# Patient Record
Sex: Female | Born: 1935
Health system: Southern US, Community
[De-identification: ages and names within clinical notes are randomized; demographics above are authoritative.]

## PROBLEM LIST (undated history)

## (undated) DIAGNOSIS — B019 Varicella without complication: Secondary | ICD-10-CM

## (undated) DIAGNOSIS — K900 Celiac disease: Secondary | ICD-10-CM

## (undated) DIAGNOSIS — I10 Essential (primary) hypertension: Secondary | ICD-10-CM

## (undated) DIAGNOSIS — T7840XA Allergy, unspecified, initial encounter: Secondary | ICD-10-CM

## (undated) DIAGNOSIS — E039 Hypothyroidism, unspecified: Secondary | ICD-10-CM

## (undated) DIAGNOSIS — Z8489 Family history of other specified conditions: Secondary | ICD-10-CM

## (undated) DIAGNOSIS — K219 Gastro-esophageal reflux disease without esophagitis: Secondary | ICD-10-CM

## (undated) DIAGNOSIS — K5790 Diverticulosis of intestine, part unspecified, without perforation or abscess without bleeding: Secondary | ICD-10-CM

## (undated) DIAGNOSIS — M1990 Unspecified osteoarthritis, unspecified site: Secondary | ICD-10-CM

## (undated) DIAGNOSIS — G2581 Restless legs syndrome: Secondary | ICD-10-CM

## (undated) DIAGNOSIS — H25019 Cortical age-related cataract, unspecified eye: Secondary | ICD-10-CM

## (undated) DIAGNOSIS — K52831 Collagenous colitis: Secondary | ICD-10-CM

## (undated) HISTORY — DX: Varicella without complication: B01.9

## (undated) HISTORY — DX: Restless legs syndrome: G25.81

## (undated) HISTORY — DX: Gastro-esophageal reflux disease without esophagitis: K21.9

## (undated) HISTORY — DX: Hypothyroidism, unspecified: E03.9

## (undated) HISTORY — DX: Essential (primary) hypertension: I10

## (undated) HISTORY — DX: Collagenous colitis: K52.831

## (undated) HISTORY — DX: Allergy, unspecified, initial encounter: T78.40XA

## (undated) HISTORY — DX: Celiac disease: K90.0

## (undated) HISTORY — PX: TONSILLECTOMY: SUR1361

---
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
## 1969-05-25 HISTORY — PX: ABDOMINAL HYSTERECTOMY: SHX81

## 1977-05-25 HISTORY — PX: CHOLECYSTECTOMY: SHX55

## 1997-10-25 ENCOUNTER — Ambulatory Visit (HOSPITAL_COMMUNITY): Admission: RE | Admit: 1997-10-25 | Discharge: 1997-10-25 | Payer: Self-pay | Admitting: Unknown Physician Specialty

## 1998-10-17 ENCOUNTER — Ambulatory Visit (HOSPITAL_COMMUNITY): Admission: RE | Admit: 1998-10-17 | Discharge: 1998-10-17 | Payer: Self-pay | Admitting: Unknown Physician Specialty

## 1999-11-27 ENCOUNTER — Ambulatory Visit (HOSPITAL_COMMUNITY): Admission: RE | Admit: 1999-11-27 | Discharge: 1999-11-27 | Payer: Self-pay | Admitting: *Deleted

## 1999-11-27 ENCOUNTER — Encounter: Payer: Self-pay | Admitting: Obstetrics & Gynecology

## 2000-11-30 ENCOUNTER — Ambulatory Visit (HOSPITAL_COMMUNITY): Admission: RE | Admit: 2000-11-30 | Discharge: 2000-11-30 | Payer: Self-pay | Admitting: *Deleted

## 2004-03-06 ENCOUNTER — Ambulatory Visit: Payer: Self-pay | Admitting: Unknown Physician Specialty

## 2004-06-25 ENCOUNTER — Ambulatory Visit: Payer: Self-pay

## 2004-07-15 ENCOUNTER — Ambulatory Visit: Payer: Self-pay

## 2004-08-19 ENCOUNTER — Ambulatory Visit: Payer: Self-pay

## 2005-03-12 ENCOUNTER — Ambulatory Visit: Payer: Self-pay | Admitting: Unknown Physician Specialty

## 2005-03-20 ENCOUNTER — Ambulatory Visit: Payer: Self-pay | Admitting: Unknown Physician Specialty

## 2005-06-22 ENCOUNTER — Ambulatory Visit: Payer: Self-pay | Admitting: Gastroenterology

## 2005-08-21 ENCOUNTER — Ambulatory Visit: Payer: Self-pay | Admitting: Gastroenterology

## 2005-09-14 ENCOUNTER — Ambulatory Visit: Payer: Self-pay | Admitting: Gastroenterology

## 2005-10-14 ENCOUNTER — Ambulatory Visit: Payer: Self-pay | Admitting: Unknown Physician Specialty

## 2006-05-25 HISTORY — PX: ROTATOR CUFF REPAIR: SHX139

## 2006-09-16 ENCOUNTER — Ambulatory Visit: Payer: Self-pay | Admitting: Unknown Physician Specialty

## 2007-08-29 ENCOUNTER — Ambulatory Visit: Payer: Self-pay | Admitting: Gastroenterology

## 2007-09-20 ENCOUNTER — Ambulatory Visit: Payer: Self-pay | Admitting: Unknown Physician Specialty

## 2008-05-25 HISTORY — PX: CATARACT EXTRACTION W/ INTRAOCULAR LENS IMPLANT: SHX1309

## 2008-09-25 ENCOUNTER — Ambulatory Visit: Payer: Self-pay | Admitting: Unknown Physician Specialty

## 2009-11-12 ENCOUNTER — Ambulatory Visit: Payer: Self-pay | Admitting: Unknown Physician Specialty

## 2010-05-23 ENCOUNTER — Ambulatory Visit: Payer: Self-pay | Admitting: Gastroenterology

## 2010-05-27 LAB — PATHOLOGY REPORT

## 2010-12-02 ENCOUNTER — Ambulatory Visit: Payer: Self-pay | Admitting: Unknown Physician Specialty

## 2012-01-06 ENCOUNTER — Ambulatory Visit: Payer: Self-pay | Admitting: Internal Medicine

## 2012-04-08 ENCOUNTER — Ambulatory Visit: Payer: Self-pay | Admitting: Gastroenterology

## 2012-05-17 ENCOUNTER — Other Ambulatory Visit: Payer: Self-pay | Admitting: Gastroenterology

## 2012-05-17 LAB — CLOSTRIDIUM DIFFICILE BY PCR

## 2012-05-17 LAB — WBCS, STOOL

## 2012-05-19 LAB — STOOL CULTURE

## 2012-06-16 ENCOUNTER — Ambulatory Visit: Payer: Self-pay | Admitting: Internal Medicine

## 2013-03-02 ENCOUNTER — Ambulatory Visit: Payer: Self-pay | Admitting: Internal Medicine

## 2013-11-13 DIAGNOSIS — K219 Gastro-esophageal reflux disease without esophagitis: Secondary | ICD-10-CM | POA: Insufficient documentation

## 2013-11-22 ENCOUNTER — Ambulatory Visit: Payer: Self-pay | Admitting: Internal Medicine

## 2013-11-27 DIAGNOSIS — R911 Solitary pulmonary nodule: Secondary | ICD-10-CM | POA: Insufficient documentation

## 2014-03-13 ENCOUNTER — Ambulatory Visit: Payer: Self-pay | Admitting: Internal Medicine

## 2014-11-20 ENCOUNTER — Other Ambulatory Visit: Payer: Self-pay | Admitting: Specialist

## 2014-11-20 DIAGNOSIS — R911 Solitary pulmonary nodule: Secondary | ICD-10-CM

## 2014-11-28 ENCOUNTER — Ambulatory Visit
Admission: RE | Admit: 2014-11-28 | Discharge: 2014-11-28 | Disposition: A | Discharge status: Home / Self Care | Payer: PPO | Source: Ambulatory Visit | Attending: Specialist | Admitting: Specialist

## 2014-11-28 DIAGNOSIS — R911 Solitary pulmonary nodule: Secondary | ICD-10-CM

## 2014-11-28 DIAGNOSIS — I2510 Atherosclerotic heart disease of native coronary artery without angina pectoris: Secondary | ICD-10-CM | POA: Insufficient documentation

## 2015-01-09 DIAGNOSIS — E039 Hypothyroidism, unspecified: Secondary | ICD-10-CM | POA: Insufficient documentation

## 2015-01-09 DIAGNOSIS — G2581 Restless legs syndrome: Secondary | ICD-10-CM | POA: Insufficient documentation

## 2015-03-21 ENCOUNTER — Other Ambulatory Visit: Payer: Self-pay | Admitting: Internal Medicine

## 2015-03-21 DIAGNOSIS — Z1231 Encounter for screening mammogram for malignant neoplasm of breast: Secondary | ICD-10-CM

## 2015-03-26 ENCOUNTER — Ambulatory Visit
Admission: RE | Admit: 2015-03-26 | Discharge: 2015-03-26 | Disposition: A | Discharge status: Home / Self Care | Payer: PPO | Source: Ambulatory Visit | Attending: Internal Medicine | Admitting: Internal Medicine

## 2015-03-26 DIAGNOSIS — Z1231 Encounter for screening mammogram for malignant neoplasm of breast: Secondary | ICD-10-CM | POA: Insufficient documentation

## 2015-06-16 DIAGNOSIS — R05 Cough: Secondary | ICD-10-CM | POA: Diagnosis not present

## 2015-09-19 DIAGNOSIS — E038 Other specified hypothyroidism: Secondary | ICD-10-CM | POA: Diagnosis not present

## 2015-09-19 DIAGNOSIS — G2581 Restless legs syndrome: Secondary | ICD-10-CM | POA: Diagnosis not present

## 2015-09-19 DIAGNOSIS — K219 Gastro-esophageal reflux disease without esophagitis: Secondary | ICD-10-CM | POA: Diagnosis not present

## 2015-09-19 DIAGNOSIS — E782 Mixed hyperlipidemia: Secondary | ICD-10-CM | POA: Diagnosis not present

## 2015-09-19 DIAGNOSIS — D649 Anemia, unspecified: Secondary | ICD-10-CM | POA: Diagnosis not present

## 2015-09-19 DIAGNOSIS — I10 Essential (primary) hypertension: Secondary | ICD-10-CM | POA: Diagnosis not present

## 2015-11-07 DIAGNOSIS — K900 Celiac disease: Secondary | ICD-10-CM | POA: Diagnosis not present

## 2015-11-07 DIAGNOSIS — K52831 Collagenous colitis: Secondary | ICD-10-CM | POA: Diagnosis not present

## 2015-11-07 DIAGNOSIS — K529 Noninfective gastroenteritis and colitis, unspecified: Secondary | ICD-10-CM | POA: Diagnosis not present

## 2015-11-10 ENCOUNTER — Other Ambulatory Visit
Admission: RE | Admit: 2015-11-10 | Discharge: 2015-11-10 | Disposition: A | Discharge status: Home / Self Care | Payer: PPO | Source: Ambulatory Visit | Attending: Gastroenterology | Admitting: Gastroenterology

## 2015-11-10 DIAGNOSIS — K529 Noninfective gastroenteritis and colitis, unspecified: Secondary | ICD-10-CM | POA: Diagnosis not present

## 2015-11-10 LAB — GASTROINTESTINAL PANEL BY PCR, STOOL (REPLACES STOOL CULTURE)
ADENOVIRUS F40/41: NOT DETECTED
ASTROVIRUS: NOT DETECTED
CYCLOSPORA CAYETANENSIS: NOT DETECTED
Campylobacter species: NOT DETECTED
Cryptosporidium: NOT DETECTED
E. COLI O157: NOT DETECTED
ENTAMOEBA HISTOLYTICA: NOT DETECTED
ENTEROPATHOGENIC E COLI (EPEC): NOT DETECTED
ENTEROTOXIGENIC E COLI (ETEC): NOT DETECTED
Enteroaggregative E coli (EAEC): NOT DETECTED
Giardia lamblia: NOT DETECTED
NOROVIRUS GI/GII: NOT DETECTED
Plesimonas shigelloides: NOT DETECTED
ROTAVIRUS A: NOT DETECTED
SAPOVIRUS (I, II, IV, AND V): NOT DETECTED
SHIGELLA/ENTEROINVASIVE E COLI (EIEC): NOT DETECTED
Salmonella species: NOT DETECTED
Shiga like toxin producing E coli (STEC): NOT DETECTED
VIBRIO CHOLERAE: NOT DETECTED
VIBRIO SPECIES: NOT DETECTED
Yersinia enterocolitica: NOT DETECTED

## 2015-11-10 LAB — C DIFFICILE QUICK SCREEN W PCR REFLEX
C DIFFICILE (CDIFF) INTERP: NEGATIVE
C Diff antigen: NEGATIVE
C Diff toxin: NEGATIVE

## 2015-12-12 DIAGNOSIS — M25562 Pain in left knee: Secondary | ICD-10-CM | POA: Diagnosis not present

## 2015-12-12 DIAGNOSIS — M1712 Unilateral primary osteoarthritis, left knee: Secondary | ICD-10-CM | POA: Diagnosis not present

## 2015-12-20 ENCOUNTER — Encounter: Payer: Self-pay | Admitting: Family Medicine

## 2015-12-20 ENCOUNTER — Ambulatory Visit (INDEPENDENT_AMBULATORY_CARE_PROVIDER_SITE_OTHER): Payer: PPO | Admitting: Family Medicine

## 2015-12-20 VITALS — BP 126/84 | HR 88 | Temp 98.1°F | Wt 175.0 lb

## 2015-12-20 DIAGNOSIS — I2510 Atherosclerotic heart disease of native coronary artery without angina pectoris: Secondary | ICD-10-CM | POA: Diagnosis not present

## 2015-12-20 DIAGNOSIS — Z0001 Encounter for general adult medical examination with abnormal findings: Secondary | ICD-10-CM

## 2015-12-20 DIAGNOSIS — Z862 Personal history of diseases of the blood and blood-forming organs and certain disorders involving the immune mechanism: Secondary | ICD-10-CM | POA: Diagnosis not present

## 2015-12-20 DIAGNOSIS — I10 Essential (primary) hypertension: Secondary | ICD-10-CM

## 2015-12-20 DIAGNOSIS — R7303 Prediabetes: Secondary | ICD-10-CM

## 2015-12-20 DIAGNOSIS — I2584 Coronary atherosclerosis due to calcified coronary lesion: Secondary | ICD-10-CM | POA: Diagnosis not present

## 2015-12-20 DIAGNOSIS — D649 Anemia, unspecified: Secondary | ICD-10-CM

## 2015-12-20 DIAGNOSIS — E039 Hypothyroidism, unspecified: Secondary | ICD-10-CM

## 2015-12-20 DIAGNOSIS — R6889 Other general symptoms and signs: Secondary | ICD-10-CM

## 2015-12-20 LAB — COMPREHENSIVE METABOLIC PANEL
ALBUMIN: 3.7 g/dL (ref 3.6–5.1)
ALT: 11 U/L (ref 6–29)
AST: 15 U/L (ref 10–35)
Alkaline Phosphatase: 103 U/L (ref 33–130)
BILIRUBIN TOTAL: 0.3 mg/dL (ref 0.2–1.2)
BUN: 10 mg/dL (ref 7–25)
CHLORIDE: 96 mmol/L — AB (ref 98–110)
CO2: 31 mmol/L (ref 20–31)
CREATININE: 0.58 mg/dL — AB (ref 0.60–0.93)
Calcium: 9.1 mg/dL (ref 8.6–10.4)
Glucose, Bld: 124 mg/dL — ABNORMAL HIGH (ref 65–99)
Potassium: 3.2 mmol/L — ABNORMAL LOW (ref 3.5–5.3)
Sodium: 135 mmol/L (ref 135–146)
TOTAL PROTEIN: 5.7 g/dL — AB (ref 6.1–8.1)

## 2015-12-20 LAB — CBC
HEMATOCRIT: 34.3 % — AB (ref 35.0–45.0)
HEMOGLOBIN: 11.7 g/dL (ref 11.7–15.5)
MCH: 31.5 pg (ref 27.0–33.0)
MCHC: 34.1 g/dL (ref 32.0–36.0)
MCV: 92.2 fL (ref 80.0–100.0)
MPV: 9.1 fL (ref 7.5–12.5)
Platelets: 254 10*3/uL (ref 140–400)
RBC: 3.72 MIL/uL — AB (ref 3.80–5.10)
RDW: 13.9 % (ref 11.0–15.0)
WBC: 4.7 10*3/uL (ref 3.8–10.8)

## 2015-12-20 LAB — IRON AND TIBC
%SAT: 19 % (ref 11–50)
IRON: 64 ug/dL (ref 45–160)
TIBC: 333 ug/dL (ref 250–450)
UIBC: 269 ug/dL (ref 125–400)

## 2015-12-20 LAB — FERRITIN: Ferritin: 27 ng/mL (ref 20–288)

## 2015-12-20 LAB — LIPID PANEL
Cholesterol: 146 mg/dL (ref 125–200)
HDL: 30 mg/dL — AB (ref >=46)
LDL CALC: 74 mg/dL (ref <130)
Total CHOL/HDL Ratio: 4.9 Ratio (ref <=5.0)
Triglycerides: 211 mg/dL — ABNORMAL HIGH (ref <150)
VLDL: 42 mg/dL — ABNORMAL HIGH (ref <30)

## 2015-12-20 LAB — TSH: TSH: 0.39 m[IU]/L — AB

## 2015-12-20 LAB — HEMOGLOBIN A1C
HEMOGLOBIN A1C: 5.6 % (ref <5.7)
MEAN PLASMA GLUCOSE: 114 mg/dL

## 2015-12-20 MED ORDER — ZOSTER VACCINE LIVE 19400 UNT/0.65ML ~~LOC~~ SUSR: 0.6500 mL | Freq: Once | SUBCUTANEOUS | 0 refills | Status: AC

## 2015-12-20 NOTE — Patient Instructions (Signed)
We will call regarding your Echo and your lab results.  We will call regarding follow up.  Take care  Dr. Lacinda Axon  Health Maintenance, Female Adopting a healthy lifestyle and getting preventive care can go a long way to promote health and wellness. Talk with your health care provider about what schedule of regular examinations is right for you. This is a good chance for you to check in with your provider about disease prevention and staying healthy. In between checkups, there are plenty of things you can do on your own. Experts have done a lot of research about which lifestyle changes and preventive measures are most likely to keep you healthy. Ask your health care provider for more information. WEIGHT AND DIET  Eat a healthy diet  Be sure to include plenty of vegetables, fruits, low-fat dairy products, and lean protein.  Do not eat a lot of foods high in solid fats, added sugars, or salt.  Get regular exercise. This is one of the most important things you can do for your health.  Most adults should exercise for at least 150 minutes each week. The exercise should increase your heart rate and make you sweat (moderate-intensity exercise).  Most adults should also do strengthening exercises at least twice a week. This is in addition to the moderate-intensity exercise.  Maintain a healthy weight  Body mass index (BMI) is a measurement that can be used to identify possible weight problems. It estimates body fat based on height and weight. Your health care provider can help determine your BMI and help you achieve or maintain a healthy weight.  For females 72 years of age and older:   A BMI below 18.5 is considered underweight.  A BMI of 18.5 to 24.9 is normal.  A BMI of 25 to 29.9 is considered overweight.  A BMI of 30 and above is considered obese.  Watch levels of cholesterol and blood lipids  You should start having your blood tested for lipids and cholesterol at 79 years of age,  then have this test every 5 years.  You may need to have your cholesterol levels checked more often if:  Your lipid or cholesterol levels are high.  You are older than 79 years of age.  You are at high risk for heart disease.  CANCER SCREENING   Lung Cancer  Lung cancer screening is recommended for adults 76-80 years old who are at high risk for lung cancer because of a history of smoking.  A yearly low-dose CT scan of the lungs is recommended for people who:  Currently smoke.  Have quit within the past 15 years.  Have at least a 30-pack-year history of smoking. A pack year is smoking an average of one pack of cigarettes a day for 1 year.  Yearly screening should continue until it has been 15 years since you quit.  Yearly screening should stop if you develop a health problem that would prevent you from having lung cancer treatment.  Breast Cancer  Practice breast self-awareness. This means understanding how your breasts normally appear and feel.  It also means doing regular breast self-exams. Let your health care provider know about any changes, no matter how small.  If you are in your 20s or 30s, you should have a clinical breast exam (CBE) by a health care provider every 1-3 years as part of a regular health exam.  If you are 47 or older, have a CBE every year. Also consider having a breast X-ray (mammogram)  every year.  If you have a family history of breast cancer, talk to your health care provider about genetic screening.  If you are at high risk for breast cancer, talk to your health care provider about having an MRI and a mammogram every year.  Breast cancer gene (BRCA) assessment is recommended for women who have family members with BRCA-related cancers. BRCA-related cancers include:  Breast.  Ovarian.  Tubal.  Peritoneal cancers.  Results of the assessment will determine the need for genetic counseling and BRCA1 and BRCA2 testing. Cervical Cancer Your  health care provider may recommend that you be screened regularly for cancer of the pelvic organs (ovaries, uterus, and vagina). This screening involves a pelvic examination, including checking for microscopic changes to the surface of your cervix (Pap test). You may be encouraged to have this screening done every 3 years, beginning at age 38.  For women ages 41-65, health care providers may recommend pelvic exams and Pap testing every 3 years, or they may recommend the Pap and pelvic exam, combined with testing for human papilloma virus (HPV), every 5 years. Some types of HPV increase your risk of cervical cancer. Testing for HPV may also be done on women of any age with unclear Pap test results.  Other health care providers may not recommend any screening for nonpregnant women who are considered low risk for pelvic cancer and who do not have symptoms. Ask your health care provider if a screening pelvic exam is right for you.  If you have had past treatment for cervical cancer or a condition that could lead to cancer, you need Pap tests and screening for cancer for at least 20 years after your treatment. If Pap tests have been discontinued, your risk factors (such as having a new sexual partner) need to be reassessed to determine if screening should resume. Some women have medical problems that increase the chance of getting cervical cancer. In these cases, your health care provider may recommend more frequent screening and Pap tests. Colorectal Cancer  This type of cancer can be detected and often prevented.  Routine colorectal cancer screening usually begins at 79 years of age and continues through 79 years of age.  Your health care provider may recommend screening at an earlier age if you have risk factors for colon cancer.  Your health care provider may also recommend using home test kits to check for hidden blood in the stool.  A small camera at the end of a tube can be used to examine your  colon directly (sigmoidoscopy or colonoscopy). This is done to check for the earliest forms of colorectal cancer.  Routine screening usually begins at age 72.  Direct examination of the colon should be repeated every 5-10 years through 79 years of age. However, you may need to be screened more often if early forms of precancerous polyps or small growths are found. Skin Cancer  Check your skin from head to toe regularly.  Tell your health care provider about any new moles or changes in moles, especially if there is a change in a mole's shape or color.  Also tell your health care provider if you have a mole that is larger than the size of a pencil eraser.  Always use sunscreen. Apply sunscreen liberally and repeatedly throughout the day.  Protect yourself by wearing long sleeves, pants, a wide-brimmed hat, and sunglasses whenever you are outside. HEART DISEASE, DIABETES, AND HIGH BLOOD PRESSURE   High blood pressure causes heart disease and  increases the risk of stroke. High blood pressure is more likely to develop in:  People who have blood pressure in the high end of the normal range (130-139/85-89 mm Hg).  People who are overweight or obese.  People who are African American.  If you are 53-87 years of age, have your blood pressure checked every 3-5 years. If you are 19 years of age or older, have your blood pressure checked every year. You should have your blood pressure measured twice--once when you are at a hospital or clinic, and once when you are not at a hospital or clinic. Record the average of the two measurements. To check your blood pressure when you are not at a hospital or clinic, you can use:  An automated blood pressure machine at a pharmacy.  A home blood pressure monitor.  If you are between 29 years and 11 years old, ask your health care provider if you should take aspirin to prevent strokes.  Have regular diabetes screenings. This involves taking a blood sample to  check your fasting blood sugar level.  If you are at a normal weight and have a low risk for diabetes, have this test once every three years after 79 years of age.  If you are overweight and have a high risk for diabetes, consider being tested at a younger age or more often. PREVENTING INFECTION  Hepatitis B  If you have a higher risk for hepatitis B, you should be screened for this virus. You are considered at high risk for hepatitis B if:  You were born in a country where hepatitis B is common. Ask your health care provider which countries are considered high risk.  Your parents were born in a high-risk country, and you have not been immunized against hepatitis B (hepatitis B vaccine).  You have HIV or AIDS.  You use needles to inject street drugs.  You live with someone who has hepatitis B.  You have had sex with someone who has hepatitis B.  You get hemodialysis treatment.  You take certain medicines for conditions, including cancer, organ transplantation, and autoimmune conditions. Hepatitis C  Blood testing is recommended for:  Everyone born from [birth year] through 1963.  Anyone with known risk factors for hepatitis C. Sexually transmitted infections (STIs)  You should be screened for sexually transmitted infections (STIs) including gonorrhea and chlamydia if:  You are sexually active and are younger than 78 years of age.  You are older than 79 years of age and your health care provider tells you that you are at risk for this type of infection.  Your sexual activity has changed since you were last screened and you are at an increased risk for chlamydia or gonorrhea. Ask your health care provider if you are at risk.  If you do not have HIV, but are at risk, it may be recommended that you take a prescription medicine daily to prevent HIV infection. This is called pre-exposure prophylaxis (PrEP). You are considered at risk if:  You are sexually active and do not regularly use  condoms or know the HIV status of your partner(s).  You take drugs by injection.  You are sexually active with a partner who has HIV. Talk with your health care provider about whether you are at high risk of being infected with HIV. If you choose to begin PrEP, you should first be tested for HIV. You should then be tested every 3 months for as long as you are taking PrEP.  PREGNANCY   If you are premenopausal and you may become pregnant, ask your health care provider about preconception counseling.  If you may become pregnant, take 400 to 800 micrograms (mcg) of folic acid every day.  If you want to prevent pregnancy, talk to your health care provider about birth control (contraception). OSTEOPOROSIS AND MENOPAUSE   Osteoporosis is a disease in which the bones lose minerals and strength with aging. This can result in serious bone fractures. Your risk for osteoporosis can be identified using a bone density scan.  If you are 47 years of age or older, or if you are at risk for osteoporosis and fractures, ask your health care provider if you should be screened.  Ask your health care provider whether you should take a calcium or vitamin D supplement to lower your risk for osteoporosis.  Menopause may have certain physical symptoms and risks.  Hormone replacement therapy may reduce some of these symptoms and risks. Talk to your health care provider about whether hormone replacement therapy is right for you.  HOME CARE INSTRUCTIONS   Schedule regular health, dental, and eye exams.  Stay current with your immunizations.   Do not use any tobacco products including cigarettes, chewing tobacco, or electronic cigarettes.  If you are pregnant, do not drink alcohol.  If you are breastfeeding, limit how much and how often you drink alcohol.  Limit alcohol intake to no more than 1 drink per day for nonpregnant women. One drink equals 12 ounces of beer, 5 ounces of wine, or 1 ounces of hard  liquor.  Do not use street drugs.  Do not share needles.  Ask your health care provider for help if you need support or information about quitting drugs.  Tell your health care provider if you often feel depressed.  Tell your health care provider if you have ever been abused or do not feel safe at home.   This information is not intended to replace advice given to you by your health care provider. Make sure you discuss any questions you have with your health care provider.   Document Released: 11/24/2010 Document Revised: 06/01/2014 Document Reviewed: 04/12/2013 Elsevier Interactive Patient Education [employer].

## 2015-12-20 NOTE — Progress Notes (Signed)
Pre visit review using our clinic review tool, if applicable. No additional management support is needed unless otherwise documented below in the visit note. 

## 2015-12-22 ENCOUNTER — Encounter: Payer: Self-pay | Admitting: Family Medicine

## 2015-12-22 DIAGNOSIS — K900 Celiac disease: Secondary | ICD-10-CM | POA: Insufficient documentation

## 2015-12-22 DIAGNOSIS — I10 Essential (primary) hypertension: Secondary | ICD-10-CM | POA: Insufficient documentation

## 2015-12-22 DIAGNOSIS — Z0001 Encounter for general adult medical examination with abnormal findings: Secondary | ICD-10-CM | POA: Insufficient documentation

## 2015-12-22 DIAGNOSIS — K52831 Collagenous colitis: Secondary | ICD-10-CM | POA: Insufficient documentation

## 2015-12-22 NOTE — Progress Notes (Signed)
Subjective:  Patient ID: Deborah Deleon, female    DOB: 1934/07/19  Age: 81 y.o. MRN: [id number]  CC: Establish care  HPI Deborah Deleon is a 81 y.o. female presents to the clinic today to establish care.  Preventative Healthcare  Pap smear: No longer needed. Has had a hysterectomy for benign reasons.  Mammogram: Up to date. In need of later this year.   Colonoscopy: Up to date. Is followed by GI.   Immunizations  Tetanus - ~ 5 years ago.  Pneumococcal - In need of second pneumococcal vaccine.  Zoster - In need of.  Labs: In need of labs today.  Alcohol use: See below.  Smoking/tobacco use: Non smoker.  Regular dental exams: Yes.   Wears seat belt: Yes.   PMH, Surgical Hx, Family Hx, Social History reviewed and updated as below.  Past Medical History:  Diagnosis Date  . Allergy   . Celiac disease   . Chicken pox   . Collagenous colitis   . GERD (gastroesophageal reflux disease)   . Hypertension   . Hypothyroidism   . Restless leg    Past Surgical History:  Procedure Laterality Date  . ABDOMINAL HYSTERECTOMY  1971  . APPENDECTOMY  1958  . CHOLECYSTECTOMY  1979   Family History  Problem Relation Age of Onset  . Breast cancer Sister   . Heart disease Mother   . Diabetes Mother   . Arthritis Father   . Heart disease Father    Social History  Substance Use Topics  . Smoking status: Never Smoker  . Smokeless tobacco: Never Used  . Alcohol use Yes   Review of Systems  Cardiovascular: Positive for leg swelling.  All other systems reviewed and are negative.  Objective:   Today's Vitals: BP 126/84 (BP Location: Left Arm, Patient Position: Sitting, Cuff Size: Normal)   Pulse 88   Temp 98.1 F (36.7 C) (Oral)   Wt 175 lb (79.4 kg)   SpO2 96%   Physical Exam  Constitutional: She is oriented to person, place, and time. She appears well-developed and well-nourished. No distress.  HENT:  Head: Normocephalic and atraumatic.  Nose: Nose normal.    Mouth/Throat: Oropharynx is clear and moist. No oropharyngeal exudate.  Normal TM's bilaterally.   Eyes: Conjunctivae are normal. No scleral icterus.  Neck: Neck supple. No thyromegaly present.  Cardiovascular: Normal rate and regular rhythm.   2/6 systolic murmur.  Pulmonary/Chest: Effort normal and breath sounds normal. She has no wheezes. She has no rales.  Abdominal: Soft. She exhibits no distension. There is no tenderness. There is no rebound and no guarding.  Musculoskeletal: Normal range of motion. She exhibits no edema.  Lymphadenopathy:    She has no cervical adenopathy.  Neurological: She is alert and oriented to person, place, and time.  Skin: Skin is warm and dry. No rash noted.  Psychiatric: She has a normal mood and affect.  Vitals reviewed.  Assessment & Plan:   Problem List Items Addressed This Visit    Essential hypertension   Relevant Medications   hydrochlorothiazide (MICROZIDE) 12.5 MG capsule   losartan (COZAAR) 25 MG tablet   Other Relevant Orders   Comprehensive metabolic panel (Completed)   Hypothyroidism   Relevant Medications   levothyroxine (SYNTHROID, LEVOTHROID) 137 MCG tablet   Other Relevant Orders   TSH (Completed)   Encounter for general adult medical examination with abnormal findings - Primary    In need of second pneumococcal vaccine and shingles vaccine. Colonoscopy  up to date. Labs today. Coronary artery calcification noted on review of EMR.  Will arrange Echo (given reports of LE edema and murmur). Will likely need to see cardiology.       Other Visit Diagnoses    History of anemia       Relevant Orders   Iron Binding Cap (TIBC) (Completed)   CBC (Completed)   Ferritin (Completed)   Coronary artery calcification       Relevant Medications   hydrochlorothiazide (MICROZIDE) 12.5 MG capsule   losartan (COZAAR) 25 MG tablet   Other Relevant Orders   ECHOCARDIOGRAM COMPLETE   Lipid panel (Completed)   Prediabetes       Relevant  Orders   Hemoglobin A1c (Completed)      Outpatient Encounter Prescriptions as of 12/20/2015  Medication Sig  . fluticasone (FLONASE) 50 MCG/ACT nasal spray Place into the nose.  . hydrochlorothiazide (MICROZIDE) 12.5 MG capsule Take by mouth.  . levocetirizine (XYZAL) 5 MG tablet Take by mouth.  . levothyroxine (SYNTHROID, LEVOTHROID) 137 MCG tablet Take by mouth.  . losartan (COZAAR) 25 MG tablet Take by mouth.  . Mesalamine (ASACOL) 400 MG CPDR DR capsule Take by mouth.  . montelukast (SINGULAIR) 10 MG tablet Take by mouth.  . pantoprazole (PROTONIX) 40 MG tablet Take by mouth.  Marland Kitchen rOPINIRole (REQUIP) 0.5 MG tablet Take one in afternoon and 2 at bedtime.  . [EXPIRED] Zoster Vaccine Live, PF, (ZOSTAVAX) 29562 UNT/0.65ML injection Inject 19,400 Units into the skin once.   No facility-administered encounter medications on file as of 12/20/2015.    Follow-up: TBD  [place]

## 2015-12-22 NOTE — Assessment & Plan Note (Signed)
In need of second pneumococcal vaccine and shingles vaccine. Colonoscopy up to date. Labs today. Coronary artery calcification noted on review of EMR.  Will arrange Echo (given reports of LE edema and murmur). Will likely need to see cardiology.

## 2015-12-25 ENCOUNTER — Ambulatory Visit (INDEPENDENT_AMBULATORY_CARE_PROVIDER_SITE_OTHER): Payer: PPO

## 2015-12-25 ENCOUNTER — Telehealth: Payer: Self-pay | Admitting: Family Medicine

## 2015-12-25 ENCOUNTER — Other Ambulatory Visit: Payer: Self-pay | Admitting: Family Medicine

## 2015-12-25 ENCOUNTER — Other Ambulatory Visit: Payer: Self-pay

## 2015-12-25 DIAGNOSIS — I2584 Coronary atherosclerosis due to calcified coronary lesion: Secondary | ICD-10-CM | POA: Diagnosis not present

## 2015-12-25 DIAGNOSIS — I2510 Atherosclerotic heart disease of native coronary artery without angina pectoris: Secondary | ICD-10-CM | POA: Diagnosis not present

## 2015-12-25 MED ORDER — LEVOTHYROXINE SODIUM 125 MCG PO TABS: 125.0000 ug | ORAL_TABLET | Freq: Every day | ORAL | 0 refills | Status: DC

## 2015-12-25 MED ORDER — POTASSIUM CHLORIDE CRYS ER 20 MEQ PO TBCR: 40.0000 meq | EXTENDED_RELEASE_TABLET | Freq: Two times a day (BID) | ORAL | 0 refills | Status: DC

## 2015-12-25 NOTE — Telephone Encounter (Signed)
Per Results note on 8/1, patient was to get decreased dose of Levothyroxine and a Potassium supplement, please advise and order if needed, thanks

## 2015-12-25 NOTE — Telephone Encounter (Signed)
Rx's sent. Needs follow up BMP in 1 week.

## 2015-12-25 NOTE — Telephone Encounter (Signed)
Pt needs a refill for levothyroxine (SYNTHROID, LEVOTHROID) 137 MCG tablet pt states it was to be changed to 1.25. And medication for the potassium. Pt did not know the name of that medication.    Pharmacy is [facility], [place], [place]  Call pt @ [phone]. Thank you!

## 2015-12-25 NOTE — Telephone Encounter (Signed)
Ok. I called pt and left pt a vm to call and sch appt. Thank you!

## 2015-12-25 NOTE — Telephone Encounter (Signed)
Please schedule lab appt in one week, orders in, thanks

## 2015-12-26 ENCOUNTER — Other Ambulatory Visit: Payer: Self-pay | Admitting: Family Medicine

## 2015-12-26 DIAGNOSIS — M1712 Unilateral primary osteoarthritis, left knee: Secondary | ICD-10-CM

## 2015-12-26 DIAGNOSIS — Z01818 Encounter for other preprocedural examination: Secondary | ICD-10-CM

## 2015-12-26 DIAGNOSIS — I2510 Atherosclerotic heart disease of native coronary artery without angina pectoris: Secondary | ICD-10-CM

## 2015-12-30 ENCOUNTER — Telehealth: Payer: Self-pay

## 2015-12-30 DIAGNOSIS — E876 Hypokalemia: Secondary | ICD-10-CM

## 2015-12-30 NOTE — Telephone Encounter (Signed)
I have placed the order. Thank you 

## 2015-12-30 NOTE — Telephone Encounter (Signed)
That it correct. Dx: Hypokalemia

## 2015-12-30 NOTE — Telephone Encounter (Signed)
Pt coming for labs 12/31/15. Just confirming all that needs to be ordered is a bmet. Thank you.

## 2015-12-31 ENCOUNTER — Other Ambulatory Visit (INDEPENDENT_AMBULATORY_CARE_PROVIDER_SITE_OTHER): Payer: PPO

## 2015-12-31 ENCOUNTER — Other Ambulatory Visit: Payer: Self-pay | Admitting: Orthopedic Surgery

## 2015-12-31 DIAGNOSIS — S83242A Other tear of medial meniscus, current injury, left knee, initial encounter: Secondary | ICD-10-CM

## 2015-12-31 DIAGNOSIS — M25562 Pain in left knee: Secondary | ICD-10-CM | POA: Diagnosis not present

## 2015-12-31 DIAGNOSIS — E876 Hypokalemia: Secondary | ICD-10-CM

## 2016-01-01 LAB — BASIC METABOLIC PANEL
BUN: 15 mg/dL (ref 6–23)
CHLORIDE: 98 meq/L (ref 96–112)
CO2: 33 mEq/L — ABNORMAL HIGH (ref 19–32)
Calcium: 9.4 mg/dL (ref 8.4–10.5)
Creatinine, Ser: 0.64 mg/dL (ref 0.40–1.20)
GFR: 95.11 mL/min (ref >60.00)
Glucose, Bld: 94 mg/dL (ref 70–99)
POTASSIUM: 3.5 meq/L (ref 3.5–5.1)
Sodium: 136 mEq/L (ref 135–145)

## 2016-01-10 ENCOUNTER — Ambulatory Visit: Payer: PPO

## 2016-01-13 ENCOUNTER — Ambulatory Visit
Admission: RE | Admit: 2016-01-13 | Discharge: 2016-01-13 | Disposition: A | Discharge status: Home / Self Care | Payer: PPO | Source: Ambulatory Visit | Attending: Orthopedic Surgery | Admitting: Orthopedic Surgery

## 2016-01-13 DIAGNOSIS — S83282A Other tear of lateral meniscus, current injury, left knee, initial encounter: Secondary | ICD-10-CM | POA: Diagnosis not present

## 2016-01-13 DIAGNOSIS — M7122 Synovial cyst of popliteal space [Baker], left knee: Secondary | ICD-10-CM | POA: Insufficient documentation

## 2016-01-13 DIAGNOSIS — M23301 Other meniscus derangements, unspecified lateral meniscus, left knee: Secondary | ICD-10-CM | POA: Insufficient documentation

## 2016-01-13 DIAGNOSIS — M25562 Pain in left knee: Secondary | ICD-10-CM | POA: Diagnosis not present

## 2016-01-13 DIAGNOSIS — S83242A Other tear of medial meniscus, current injury, left knee, initial encounter: Secondary | ICD-10-CM | POA: Insufficient documentation

## 2016-01-13 DIAGNOSIS — M94262 Chondromalacia, left knee: Secondary | ICD-10-CM | POA: Diagnosis not present

## 2016-01-14 ENCOUNTER — Telehealth: Payer: Self-pay | Admitting: Family Medicine

## 2016-01-14 NOTE — Telephone Encounter (Signed)
Pt dropped off FMLA paperwork to be filled out.. Placed in Dr. Lacinda Axon folder up front.Marland Kitchen

## 2016-01-15 NOTE — Progress Notes (Signed)
Cardiology Office Note   Date:  01/16/2016   ID:  Deborah Deleon, DOB 1935/04/30, MRN [id number]  Referring Doctor:  Coral Spikes, DO   Cardiologist:   Wende Bushy, MD   Reason for consultation:  Chief Complaint  Patient presents with  . Other    Coronary artery calcification, possible preoperative evaluation      History of Present Illness: Deborah Deleon is a 81 y.o. female who presents for Evaluation for coronary artery calcification, patient going for possible knee surgery on the left side.  Patient went for CT scan and was noted to have three-vessel coronary artery calcification as follows: CT 11/28/2014: IMPRESSION: 1. Right upper lobe pulmonary nodule is stable and therefore considered benign. 2. Three-vessel coronary artery calcification   Patient has also been evaluated by orthopedics and may likely need surgery on the left knee. Initially, she was told that she may need knee replacement surgery. Subsequently, another orthopedic group tells her that it may be a torn muscle and will need surgery for this.  Patient has chronic history of shortness of breath for 6-8 months now. This has gotten worse over time. Symptoms in the chest, moderate in intensity, lasting a few minutes at a time, resolved with rest.  Patient also brings up reports of mild chest discomfort usually with exertion, in the chest, nonradiating, mild in intensity, lasting minutes at a time, resolved with rest.  She also has had a worsening leg swelling over 6 months now. There is some discomfort over both lower extremities.  She does not have orthopnea, or PND. No palpitations, loss of consciousness. No abdominal pain.  ROS:  Please see the history of present illness. Aside from mentioned under HPI, all other systems are reviewed and negative.     Past Medical History:  Diagnosis Date  . Allergy   . Celiac disease   . Chicken pox   . Collagenous colitis   . GERD (gastroesophageal reflux  disease)   . Hypertension   . Hypothyroidism   . Restless leg     Past Surgical History:  Procedure Laterality Date  . ABDOMINAL HYSTERECTOMY  1971  . APPENDECTOMY  1958  . CHOLECYSTECTOMY  1979     reports that she has never smoked. She has never used smokeless tobacco. She reports that she drinks alcohol. She reports that she does not use drugs.   family history includes Arthritis in her father; Breast cancer in her sister; Diabetes in her mother; Heart disease in her father and mother.   Outpatient Medications Prior to Visit  Medication Sig Dispense Refill  . fluticasone (FLONASE) 50 MCG/ACT nasal spray Place into the nose.    . levocetirizine (XYZAL) 5 MG tablet Take by mouth.    . levothyroxine (SYNTHROID, LEVOTHROID) 125 MCG tablet Take 1 tablet (125 mcg total) by mouth daily before breakfast. 90 tablet 0  . losartan (COZAAR) 25 MG tablet Take by mouth.    . Mesalamine (ASACOL) 400 MG CPDR DR capsule Take by mouth.    . montelukast (SINGULAIR) 10 MG tablet Take by mouth.    . pantoprazole (PROTONIX) 40 MG tablet Take by mouth.    . potassium chloride SA (K-DUR,KLOR-CON) 20 MEQ tablet Take 2 tablets (40 mEq total) by mouth 2 (two) times daily. 4 tablet 0  . rOPINIRole (REQUIP) 0.5 MG tablet Take one in afternoon and 2 at bedtime.    . hydrochlorothiazide (MICROZIDE) 12.5 MG capsule Take by mouth.  No facility-administered medications prior to visit.      Allergies: Penicillins and Sulfa antibiotics    PHYSICAL EXAM: VS:  BP 110/80 (BP Location: Left Arm, Patient Position: Sitting, Cuff Size: Normal)   Pulse 91   Ht 5\' 5"  (1.651 m)   Wt 175 lb 1.9 oz (79.4 kg)   BMI 29.14 kg/m  , Body mass index is 29.14 kg/m. Wt Readings from Last 3 Encounters:  01/16/16 175 lb 1.9 oz (79.4 kg)  12/20/15 175 lb (79.4 kg)    GENERAL:  well developed, well nourished, not in acute distress HEENT: normocephalic, pink conjunctivae, anicteric sclerae, no xanthelasma, normal  dentition, oropharynx clear NECK:  no neck vein engorgement, JVP normal, no hepatojugular reflux, carotid upstroke brisk and symmetric, no bruit, no thyromegaly, no lymphadenopathy LUNGS:  good respiratory effort, clear to auscultation bilaterally CV:  PMI not displaced, no thrills, no lifts, S1 and S2 within normal limits, no palpable S3 or S4, Soft systolic murmur noted, no rubs, no gallops ABD:  Soft, nontender, nondistended, normoactive bowel sounds, no abdominal aortic bruit, no hepatomegaly, no splenomegaly MS: nontender back, no kyphosis, no scoliosis, no joint deformities EXT:  2+ DP/PT pulses, +2 edema, no varicosities, no cyanosis, no clubbing SKIN: warm, nondiaphoretic, normal turgor, no ulcers NEUROPSYCH: alert, oriented to person, place, and time, sensory/motor grossly intact, normal mood, appropriate affect  Recent Labs: 12/20/2015: ALT 11; Hemoglobin 11.7; Platelets 254; TSH 0.39 12/31/2015: BUN 15; Creatinine, Ser 0.64; Potassium 3.5; Sodium 136   Lipid Panel    Component Value Date/Time   CHOL 146 12/20/2015 1612   TRIG 211 (H) 12/20/2015 1612   HDL 30 (L) 12/20/2015 1612   CHOLHDL 4.9 12/20/2015 1612   VLDL 42 (H) 12/20/2015 1612   LDLCALC 74 12/20/2015 1612     Other studies Reviewed:  EKG:  The ekg from 01/16/2016 was personally reviewed by me and it revealed sinus rhythm, 91 BPM.  Additional studies/ records that were reviewed personally reviewed by me today include:  Echo 12/25/2015: Left ventricle: The cavity size was normal. Wall thickness was   normal. Systolic function was normal. The estimated ejection   fraction was in the range of 60% to 65%. Wall motion was normal;   there were no regional wall motion abnormalities. Left   ventricular diastolic function parameters were normal for the   patient&'s age. - Aortic valve: There was mild to moderate regurgitation. - Mitral valve: There was mild regurgitation.  ASSESSMENT AND PLAN: Shortness of  breath Chest pain Coronary artery calcification Coronary artery calcification establish his diagnosis of CAD. With her symptoms, we'll need to proceed with evaluation for ischemia with pharmacologic nuclear stress test.  Leg swelling Leg swelling and shortness of breath may point to congestive heart failure with preserved ejection fraction, likely acute on chronic Recommend to discontinue HCTZ. Trial with Lasix 20 mg once a day for one week together with potassium replacement 2 during the time that she is on Lasix. We will repeat a BMP in 1 week. We will have her follow-up in the office after her tests within 2 weeks. Recommend lower extremity venous study to rule out DVT. Sodium restriction recommended.  Aortic insufficiency Mild to moderate aortic insufficiency, and no significant murmur noted. Blood pressure control recommended. Serial evaluation recommended.  Mitral regurgitation Mild MR noted on echo. Soft systolic murmur noted. Blood pressure control recommended. Serial evaluation recommended.  Hypertension BP is well controlled. Continue monitoring BP. Continue current medical therapy and lifestyle changes.  Preoperative evaluation prior to possible left knee surgery Patient will need to undergo stress testing first prior to further cardiac risk stratification.   Current medicines are reviewed at length with the patient today.  The patient does not have concerns regarding medicines.  Labs/ tests ordered today include:  Orders Placed This Encounter  Procedures  . NM Myocar Multi W/Spect W/Wall Motion / EF  . Basic Metabolic Panel (BMET)  . EKG 12-Lead    I had a lengthy and detailed discussion with the patient regarding diagnoses, prognosis, diagnostic options, treatment options , and side effects of medications.   I counseled the patient on importance of lifestyle modification including heart healthy diet, regular physical activity.Once cardiac workup is  done   Disposition:   FU with undersigned after tests   I spent at least 60 minutes with the patient today and more than 50% of the time was spent counseling the patient and coordinating care.   Signed, Wende Bushy, MD  01/16/2016 11:42 AM    [place]  This note was generated in part with voice recognition software and I apologize for any typographical errors that were not detected and corrected.

## 2016-01-15 NOTE — Telephone Encounter (Signed)
I need the details regarding this.

## 2016-01-15 NOTE — Telephone Encounter (Signed)
Placed in Dr.Cooks red (form) folder for him to fill out.

## 2016-01-16 ENCOUNTER — Encounter: Payer: Self-pay | Admitting: Cardiology

## 2016-01-16 ENCOUNTER — Ambulatory Visit (INDEPENDENT_AMBULATORY_CARE_PROVIDER_SITE_OTHER): Payer: PPO | Admitting: Cardiology

## 2016-01-16 ENCOUNTER — Telehealth: Payer: Self-pay | Admitting: Cardiology

## 2016-01-16 VITALS — BP 110/80 | HR 91 | Ht 65.0 in | Wt 175.1 lb

## 2016-01-16 DIAGNOSIS — M7989 Other specified soft tissue disorders: Secondary | ICD-10-CM | POA: Diagnosis not present

## 2016-01-16 DIAGNOSIS — Z01818 Encounter for other preprocedural examination: Secondary | ICD-10-CM | POA: Diagnosis not present

## 2016-01-16 DIAGNOSIS — R079 Chest pain, unspecified: Secondary | ICD-10-CM

## 2016-01-16 DIAGNOSIS — I340 Nonrheumatic mitral (valve) insufficiency: Secondary | ICD-10-CM

## 2016-01-16 DIAGNOSIS — I351 Nonrheumatic aortic (valve) insufficiency: Secondary | ICD-10-CM

## 2016-01-16 DIAGNOSIS — R0602 Shortness of breath: Secondary | ICD-10-CM | POA: Diagnosis not present

## 2016-01-16 DIAGNOSIS — I10 Essential (primary) hypertension: Secondary | ICD-10-CM

## 2016-01-16 MED ORDER — FUROSEMIDE 20 MG PO TABS: 20.0000 mg | ORAL_TABLET | Freq: Every day | ORAL | 0 refills | Status: DC

## 2016-01-16 MED ORDER — POTASSIUM CHLORIDE CRYS ER 20 MEQ PO TBCR: 40.0000 meq | EXTENDED_RELEASE_TABLET | Freq: Every day | ORAL | 0 refills | Status: DC

## 2016-01-16 NOTE — Telephone Encounter (Signed)
Spoke with Dr. Yvone Neu here in the office and she wants to order Potassium 20 meq take 2 tablets daily when taking lasix. Will send prescription over to pharmacy.

## 2016-01-16 NOTE — Telephone Encounter (Signed)
Details of FMLA

## 2016-01-16 NOTE — Telephone Encounter (Signed)
Patient needs prescription for potassium. Previous prescription was only for 4 tablets. Will check with Dr. Yvone Neu.

## 2016-01-16 NOTE — Telephone Encounter (Signed)
Pharmacy calling stating they need to know  potassium for 1 week with Lasix  Needs a prescription for Potassium to be sent in  Pt is at pharmacy now

## 2016-01-16 NOTE — Telephone Encounter (Signed)
See note below. Pt came today and saw Ingal.

## 2016-01-16 NOTE — Patient Instructions (Addendum)
Medication Instructions:  Your physician has recommended you make the following change in your medication:  1. STOP Taking hydrochlorothiazide 2. START Lasix 20 mg once daily for one week 3. TAKE Potassium once daily for one week with Lasix    Labwork: Your physician recommends that you return for lab work in: 1 week for BMP   Testing/Procedures: Your physician has requested that you have a lower or upper extremity venous duplex. This test is an ultrasound of the veins in the legs or arms. It looks at venous blood flow that carries blood from the heart to the legs or arms. Allow one hour for a Lower Venous exam. Allow thirty minutes for an Upper Venous exam. There are no restrictions or special instructions.   [place]  Your caregiver has ordered a Stress Test with nuclear imaging. The purpose of this test is to evaluate the blood supply to your heart muscle. This procedure is referred to as a "Non-Invasive Stress Test." This is because other than having an IV started in your vein, nothing is inserted or "invades" your body. Cardiac stress tests are done to find areas of poor blood flow to the heart by determining the extent of coronary artery disease (CAD). Some patients exercise on a treadmill, which naturally increases the blood flow to your heart, while others who are  unable to walk on a treadmill due to physical limitations have a pharmacologic/chemical stress agent called Lexiscan . This medicine will mimic walking on a treadmill by temporarily increasing your coronary blood flow.   Please note: these test may take anywhere between 2-4 hours to complete  PLEASE REPORT TO [place] AT THE FIRST DESK WILL DIRECT YOU WHERE TO GO  Date of Procedure:_Friday January 24, 2016 at 07:30AM_  Arrival Time for Procedure:_Arrive at 07:15AM to register___  Instructions regarding medication:   __X__ : Take Lasix after procedure has been done.    PLEASE  NOTIFY THE OFFICE AT LEAST 67 HOURS IN ADVANCE IF YOU ARE UNABLE TO KEEP YOUR APPOINTMENT.  [phone] AND  PLEASE NOTIFY NUCLEAR MEDICINE AT [facility] AT LEAST 24 HOURS IN ADVANCE IF YOU ARE UNABLE TO KEEP YOUR APPOINTMENT. [phone]  How to prepare for your Myoview test:  1. Do not eat or drink after midnight 2. No caffeine for 24 hours prior to test 3. No smoking 24 hours prior to test. 4. Your medication may be taken with water.  If your doctor stopped a medication because of this test, do not take that medication. 5. Ladies, please do not wear dresses.  Skirts or pants are appropriate. Please wear a short sleeve shirt. 6. No perfume, cologne or lotion. 7. Wear comfortable walking shoes. No heels!  Follow-Up: Your physician recommends that you schedule a follow-up appointment in: 2 weeks with Dr. Yvone Neu.  It was a pleasure seeing you today here in the office. Please do not hesitate to give [place] a call back if you have any further questions. [place], BSN      Any Other Special Instructions Will Be Listed Below (If Applicable).     If you need a refill on your cardiac medications before your next appointment, please call your pharmacy.  Pharmacologic Stress Electrocardiogram A pharmacologic stress electrocardiogram is a heart (cardiac) test that uses nuclear imaging to evaluate the blood supply to your heart. This test may also be called a pharmacologic stress electrocardiography. Pharmacologic means that a medicine is used to increase  your heart rate and blood pressure.  This stress test is done to find areas of poor blood flow to the heart by determining the extent of coronary artery disease (CAD). Some people exercise on a treadmill, which naturally increases the blood flow to the heart. For those people unable to exercise on a treadmill, a medicine is used. This medicine stimulates your heart and will cause your heart to beat harder and more quickly, as if you  were exercising.  Pharmacologic stress tests can help determine:  The adequacy of blood flow to your heart during increased levels of activity in order to clear you for discharge home.  The extent of coronary artery blockage caused by CAD.  Your prognosis if you have suffered a heart attack.  The effectiveness of cardiac procedures done, such as an angioplasty, which can increase the circulation in your coronary arteries.  Causes of chest pain or pressure. LET [facility] CARE PROVIDER KNOW ABOUT:  Any allergies you have.  All medicines you are taking, including vitamins, herbs, eye drops, creams, and over-the-counter medicines.  Previous problems you or members of your family have had with the use of anesthetics.  Any blood disorders you have.  Previous surgeries you have had.  Medical conditions you have.  Possibility of pregnancy, if this applies.  If you are currently breastfeeding. RISKS AND COMPLICATIONS Generally, this is a safe procedure. However, as with any procedure, complications can occur. Possible complications include:  You develop pain or pressure in the following areas:  Chest.  Jaw or neck.  Between your shoulder blades.  Radiating down your left arm.  Headache.  Dizziness or light-headedness.  Shortness of breath.  Increased or irregular heartbeat.  Low blood pressure.  Nausea or vomiting.  Flushing.  Redness going up the arm and slight pain during injection of medicine.  Heart attack (rare). BEFORE THE PROCEDURE   Avoid all forms of caffeine for 24 hours before your test or as directed by your health care provider. This includes coffee, tea (even decaffeinated tea), caffeinated sodas, chocolate, cocoa, and certain pain medicines.  Follow your health care provider's instructions regarding eating and drinking before the test.  Take your medicines as directed at regular times with water unless instructed otherwise. Exceptions may  include:  If you have diabetes, ask how you are to take your insulin or pills. It is common to adjust insulin dosing the morning of the test.  If you are taking beta-blocker medicines, it is important to talk to your health care provider about these medicines well before the date of your test. Taking beta-blocker medicines may interfere with the test. In some cases, these medicines need to be changed or stopped 24 hours or more before the test.  If you wear a nitroglycerin patch, it may need to be removed prior to the test. Ask your health care provider if the patch should be removed before the test.  If you use an inhaler for any breathing condition, bring it with you to the test.  If you are an outpatient, bring a snack so you can eat right after the stress phase of the test.  Do not smoke for 4 hours prior to the test or as directed by your health care provider.  Do not apply lotions, powders, creams, or oils on your chest prior to the test.  Wear comfortable shoes and clothing. Let your health care provider know if you were unable to complete or follow the preparations for your test. PROCEDURE  Multiple patches (electrodes) will be put on your chest. If needed, small areas of your chest may be shaved to get better contact with the electrodes. Once the electrodes are attached to your body, multiple wires will be attached to the electrodes, and your heart rate will be monitored.  An IV access will be started. A nuclear trace (isotope) is given. The isotope may be given intravenously, or it may be swallowed. Nuclear refers to several types of radioactive isotopes, and the nuclear isotope lights up the arteries so that the nuclear images are clear. The isotope is absorbed by your body. This results in low radiation exposure.  A resting nuclear image is taken to show how your heart functions at rest.  A medicine is given through the IV access.  A second scan is done about 1 hour after the  medicine injection and determines how your heart functions under stress.  During this stress phase, you will be connected to an electrocardiogram machine. Your blood pressure and oxygen levels will be monitored. AFTER THE PROCEDURE   Your heart rate and blood pressure will be monitored after the test.  You may return to your normal schedule, including diet,activities, and medicines, unless your health care provider tells you otherwise.   This information is not intended to replace advice given to you by your health care provider. Make sure you discuss any questions you have with your health care provider.   Document Released: 09/27/2008 Document Revised: 05/16/2013 Document Reviewed: 01/16/2013 Elsevier Interactive Patient Education 2016 [place] Metabolic Panel WHY AM I HAVING THIS TEST? A basic metabolic panel measures levels of the following substances in your blood:   Glucose. Glucose is a simple sugar that serves as the main source of energy for your body.  Creatinine. Creatinine is a waste product of normal muscle activity. It is excreted from the body by the kidneys.  Blood urea nitrogen (BUN). Urea nitrogen is a waste product of protein breakdown. It is produced when excess protein in your body is broken down and used for energy. It is excreted by the kidneys.  Electrolytes. Electrolytes are negatively or positively charged particles that are dissolved in the water of different body compartments. This includes the serum portion of blood, water inside cells, and water outside cells. Concentrations of electrolytes vary among the different fluid compartments. Electrolytes are tightly regulated to maintain a salt-water and acid-base balance in the body. The electrolytes measured in a basic metabolic panel include:  Potassium.  Sodium.  Chloride.  Calcium.  Bicarbonate. WHAT KIND OF SAMPLE IS TAKEN? A blood sample is required for this test. It is usually  collected by inserting a needle into a vein. HOW DO I PREPARE FOR THE TEST? Your health care provider may ask you to avoid eating or drinking anything before your blood sample is taken. Do not eat or drink anything after midnight on the night before the procedure or as directed by your health care provider. WHAT ARE THE REFERENCE RANGES? Reference ranges are considered healthy ranges established after testing a large group of healthy people. Reference ranges may vary among different people, labs, and hospitals. It is your responsibility to obtain your test results. Ask the lab or department performing the test when and how you will get your results. The following are reference ranges for each part of a basic metabolic panel: Glucose  Cord: 45-96 mg/dL or 2.5-5.3 mmol/L (SI units).  Premature infant: 20-60 mg/dL or 1.1-3.3 mmol/L.  Neonate:  30-60 mg/dL or 1.7-3.3 mmol/L.  Infant: 40-90 mg/dL or 2.2-5 mmol/L.  Child under 30 years old: 60-100 mg/dL or 3.3-5.5 mmol/L.  Adult or child over 29 years old:  Fasting: 70-110 mg/dL or less than 6.1 mmol/L.  Random (nonfasting or casual): less than or equal to 200 mg/dL or less than 11.1 mmol/L.  Elderly: increase in normal range after age 54 years. Creatinine  Child under 78 years old: 0.1-0.4 mg/dL.  Child 6-70 years old: 0.2-0.5 mg/dL.  Child 54-72 years old: 0.3-0.6 mg/dL.  Child or adolescent 23-7 years old: 0.4-1 mg/dL.  Adult 57-57 years old:  Female: 0.5-1 mg/dL.  Female: 0.6-1.2 mg/dL.  Adult 55-5 years old:  Female: 0.5-1.1 mg/dL.  Female: 0.6-1.3 mg/dL.  Adult 72 years old and above:  Female: 0.5-1.2 mg/dL.  Female: 0.7-1.3 mg/dL. BUN  Cord: 21-40 mg/dL.  Newborn: 3-12 mg/dL.  Infant: 5-18 mg/dL.  Child: 5-18 mg/dL.  Adult: 10-20 mg/dL or 3.6-7.1 mmol/L (SI units).  Elderly: may be slightly higher than adult. Potassium  Newborn: 3.9-5.9 mEq/L.  Infant: 4.1-5.3 mEq/L.  Child: 3.4-4.7 mEq/L.  Adult or  elderly: 3.5-5 mEq/L or 3.5-5 mmol/L (SI units). Sodium  Newborn: 134-144 mEq/L.  Infant: 134-150 mEq/L.  Child: 136-145 mEq/L.  Adult or elderly: 136-145 mEq/L or 136-145 mmol/L (SI units). Chloride  Premature infant: 95-110 mEq/L.  Newborn: 96-106 mEq/L.  Child: 90-110 mEq/L.  Adult or elderly: 98-106 mEq/L or 98-106 mmol/L (SI units). Calcium  Total calcium:  Newborn under 80 days old: 7.6-10.4 mg/dL or 1.9-2.6 mmol/L.  Umbilical: [id number] mg/dL or 2.25-2.88 mmol/L.  67 days to 79 years old: 9-10.6 mg/dL or 2.30-2.65 mmol/L.  Child: 8.8-10.8 mg/dL or 2.2-2.7 mmol/L.  Adult: 9-10.5 mg/dL or 2.25-2.62 mmol/L.  Ionized calcium:  Newborn: 4.20-5.58 mg/dL or 1.05-1.37 mmol/L.  2 months to 79 years old: 4.80-5.52 mg/dL or 1.20-1.38 mmol/L.  Adult: 4.5-5.6 mg/dL or 1.05-1.30 mmol/L. Bicarbonate  Newborn: 13-22 mEq/L.  Infant: 20-28 mEq/L.  Child: 20-28 mEq/L.  Adult or elderly: 23-30 mEq/L or 23-30 mmol/L (SI units). WHAT DO THE RESULTS MEAN? Diet and levels of activity can have an effect on your test results. Sometimes they can be the cause of values that are outside of normal limits. However, sometimes values outside normal limits can indicate a medical disorder.  Glucose:  Abnormally high glucose levels (hyperglycemia) are usually associated with prediabetes mellitus and diabetes mellitus. They can also occur with severe stress on the body. This can come from surgery or events such as stroke or trauma. Overactive thyroid gland and pancreatitis or pancreatic cancer can also cause abnormally high glucose levels.  Abnormally low glucose levels (hypoglycemia) can occur with underactive thyroid gland and rare insulin-secreting tumors (insulinoma).  Creatinine:  Abnormally high creatinine levels are most commonly seen in kidney failure. They can also be seen with overactive thyroid (hyperthyroidism), conditions related to overgrowth of the body, abnormal breakdown of  muscle tissue, and early muscular dystrophy.  Abnormally low creatinine levels can indicate low muscle mass associated with malnutrition or late-stage muscular dystrophy.  BUN:  Abnormally high BUN levels generally mean that your kidneys are not functioning normally.  Abnormally low BUN levels can be seen with malnutrition and liver failure.  Potassium:  Abnormally high potassium levels are most often seen with kidney disease, massive destruction of red blood cells, and adrenal gland failure.  Abnormally low potassium levels are seen with excessive levels of the hormone aldosterone.  Sodium:  Abnormally high sodium levels can be seen with dehydration, excessive  thirst, and urination due to abnormally low levels of antidiuretic hormone (diabetes insipidus). They can also be seen with excessive levels of aldosterone or cortisol in the body.  Abnormally low levels of sodium can be seen with congestive heart failure, cirrhosis of the liver, kidney failure, and the syndrome of inappropriate antidiuretic hormone (SIADH). Chloride:  Abnormally high levels of chloride can be seen with acute kidney failure, diabetes insipidus, prolonged diarrhea, and poisoning with aspirin or bromide.  Abnormally low levels of chloride can be seen with prolonged vomiting, acute adrenal gland failure, and SIADH.  Calcium:  Abnormally high levels of calcium can occur with excessive activity of the parathyroid glands, certain cancers, and a type of inflammation seen in sarcoidosis and tuberculosis.  Abnormally low levels of calcium can be seen with underactive parathyroid glands, vitamin D deficiency, and acute pancreatitis.  Bicarbonate:  Abnormally high bicarbonate levels are seen after prolonged vomiting and diuretic therapy, which lead to a decrease in the amount of acid in the body (metabolic alkalosis). They can also be seen in conditions that increase the amount of bicarbonate in the body. These  conditions include rare hereditary disorders that interfere with how your kidneys handle electrolytes.  Abnormally low bicarbonate levels are seen with conditions that cause your body to produce too much acid (metabolic acidosis). These conditions include uncontrolled diabetes mellitus and poisoning with aspirin, methanol, or antifreeze (ethylene glycol). Talk with your health care provider to discuss your results, treatment options, and if necessary, the need for more tests. Talk with your health care provider if you have any questions about your results.   This information is not intended to replace advice given to you by your health care provider. Make sure you discuss any questions you have with your health care provider.   Document Released: 06/03/2004 Document Revised: 06/01/2014 Document Reviewed: 10/09/2013 Elsevier Interactive Patient Education [employer].

## 2016-01-16 NOTE — Telephone Encounter (Signed)
In regards to?

## 2016-01-20 NOTE — Telephone Encounter (Signed)
I have no problem filling this out but FMLA is very specific. I need specific dates that she has and will be out and when she will return.  She may want to wait and have this done by her surgeon.

## 2016-01-20 NOTE — Telephone Encounter (Signed)
Call pt and ask for more specific dates.

## 2016-01-20 NOTE — Telephone Encounter (Signed)
Please advise regarding FMLA forms and questions. thanks

## 2016-01-20 NOTE — Telephone Encounter (Signed)
[facility]  PCP wants to know what the FMLA paperwork is for.

## 2016-01-20 NOTE — Telephone Encounter (Signed)
Patient requested update on her FMLA paperwork.  She requested them to be mailed , there was a pre-stamped envelope  Pt contact [phone]

## 2016-01-20 NOTE — Telephone Encounter (Signed)
Pt stated that the FMLA paperwork was for her job at [employer]. She stated that she has had a number of test and upcoming test being ordered by the cardiologist with [facility] -Dr. Dennie Bible, causing her to need the FMLA paperwork.

## 2016-01-20 NOTE — Telephone Encounter (Signed)
Deborah Deleon returned your phone call and said she needs the FMLA paperwork to cover today through six weeks. Please give her a call if needed.  Pt's ph# [phone] (this is the best # for her when she's working) Thank you.

## 2016-01-20 NOTE — Telephone Encounter (Signed)
Patient wants dates from 8/29-10/27 for FMLA paperwork.

## 2016-01-20 NOTE — Telephone Encounter (Signed)
[facility] PCP wanted to know what dates she needed the FMLA to cover.

## 2016-01-21 ENCOUNTER — Ambulatory Visit
Admission: RE | Admit: 2016-01-21 | Discharge: 2016-01-21 | Disposition: A | Discharge status: Home / Self Care | Payer: PPO | Source: Ambulatory Visit | Attending: Gastroenterology | Admitting: Gastroenterology

## 2016-01-21 ENCOUNTER — Ambulatory Visit: Payer: PPO | Admitting: Anesthesiology

## 2016-01-21 ENCOUNTER — Encounter: Payer: Self-pay | Admitting: *Deleted

## 2016-01-21 ENCOUNTER — Encounter
Admission: RE | Disposition: A | Discharge status: Home / Self Care | Payer: Self-pay | Source: Ambulatory Visit | Attending: Gastroenterology

## 2016-01-21 DIAGNOSIS — E039 Hypothyroidism, unspecified: Secondary | ICD-10-CM | POA: Insufficient documentation

## 2016-01-21 DIAGNOSIS — Z880 Allergy status to penicillin: Secondary | ICD-10-CM | POA: Diagnosis not present

## 2016-01-21 DIAGNOSIS — R197 Diarrhea, unspecified: Secondary | ICD-10-CM | POA: Diagnosis not present

## 2016-01-21 DIAGNOSIS — K529 Noninfective gastroenteritis and colitis, unspecified: Secondary | ICD-10-CM | POA: Insufficient documentation

## 2016-01-21 DIAGNOSIS — Z7982 Long term (current) use of aspirin: Secondary | ICD-10-CM | POA: Diagnosis not present

## 2016-01-21 DIAGNOSIS — K5730 Diverticulosis of large intestine without perforation or abscess without bleeding: Secondary | ICD-10-CM | POA: Insufficient documentation

## 2016-01-21 DIAGNOSIS — K5520 Angiodysplasia of colon without hemorrhage: Secondary | ICD-10-CM | POA: Diagnosis not present

## 2016-01-21 DIAGNOSIS — K219 Gastro-esophageal reflux disease without esophagitis: Secondary | ICD-10-CM | POA: Insufficient documentation

## 2016-01-21 DIAGNOSIS — K640 First degree hemorrhoids: Secondary | ICD-10-CM | POA: Diagnosis not present

## 2016-01-21 DIAGNOSIS — I10 Essential (primary) hypertension: Secondary | ICD-10-CM | POA: Diagnosis not present

## 2016-01-21 DIAGNOSIS — K52831 Collagenous colitis: Secondary | ICD-10-CM | POA: Diagnosis not present

## 2016-01-21 DIAGNOSIS — D123 Benign neoplasm of transverse colon: Secondary | ICD-10-CM | POA: Insufficient documentation

## 2016-01-21 DIAGNOSIS — K5790 Diverticulosis of intestine, part unspecified, without perforation or abscess without bleeding: Secondary | ICD-10-CM | POA: Diagnosis not present

## 2016-01-21 DIAGNOSIS — K635 Polyp of colon: Secondary | ICD-10-CM | POA: Diagnosis not present

## 2016-01-21 DIAGNOSIS — Q2733 Arteriovenous malformation of digestive system vessel: Secondary | ICD-10-CM | POA: Diagnosis not present

## 2016-01-21 HISTORY — PX: COLONOSCOPY WITH PROPOFOL: SHX5780

## 2016-01-21 LAB — HM COLONOSCOPY

## 2016-01-21 SURGERY — COLONOSCOPY WITH PROPOFOL
Anesthesia: General

## 2016-01-21 MED ORDER — SODIUM CHLORIDE 0.9 % IV SOLN: INTRAVENOUS | Status: DC

## 2016-01-21 MED ORDER — MIDAZOLAM HCL 5 MG/5ML IJ SOLN
INTRAMUSCULAR | Status: DC | PRN
  Administered 2016-01-21: 1 mg via INTRAVENOUS

## 2016-01-21 MED ORDER — FENTANYL CITRATE (PF) 100 MCG/2ML IJ SOLN
INTRAMUSCULAR | Status: DC | PRN
  Administered 2016-01-21: 50 ug via INTRAVENOUS

## 2016-01-21 MED ORDER — LIDOCAINE 2% (20 MG/ML) 5 ML SYRINGE
INTRAMUSCULAR | Status: DC | PRN
  Administered 2016-01-21: 40 mg via INTRAVENOUS

## 2016-01-21 MED ORDER — PROPOFOL 10 MG/ML IV BOLUS
INTRAVENOUS | Status: DC | PRN
  Administered 2016-01-21: 100 mg via INTRAVENOUS

## 2016-01-21 MED ORDER — PROPOFOL 500 MG/50ML IV EMUL
INTRAVENOUS | Status: DC | PRN
  Administered 2016-01-21: 140 ug/kg/min via INTRAVENOUS

## 2016-01-21 MED ORDER — SODIUM CHLORIDE 0.9 % IV SOLN
INTRAVENOUS | Status: DC
  Administered 2016-01-21: 09:00:00 via INTRAVENOUS

## 2016-01-21 NOTE — Op Note (Signed)
[facility] Gastroenterology Patient Name: Deborah Deleon Procedure Date: 01/21/2016 10:18 AM MRN: [id number] Account #: [id number] Date of Birth: Dec 03, 1934 Admit Type: Outpatient Age: 81 Room: [facility] ENDO ROOM 3 Gender: Female Note Status: Finalized Procedure:            Colonoscopy Indications:          Chronic diarrhea, Change in bowel habits Providers:            Lollie Sails, MD Referring MD:         Barnie Del. Lacinda Axon (Referring MD) Medicines:            Monitored Anesthesia Care Complications:        No immediate complications. Procedure:            Pre-Anesthesia Assessment:                       - ASA Grade Assessment: III - A patient with severe                        systemic disease.                       After obtaining informed consent, the colonoscope was                        passed under direct vision. Throughout the procedure,                        the patient's blood pressure, pulse, and oxygen                        saturations were monitored continuously. The                        Colonoscope was introduced through the anus and                        advanced to the the cecum, identified by appendiceal                        orifice and ileocecal valve. The colonoscopy was                        performed without difficulty. The patient tolerated the                        procedure well. The quality of the bowel preparation                        was good. Findings:      Multiple small and large-mouthed diverticula were found in the sigmoid       colon, descending colon and distal transverse colon.      A 3 mm polyp was found in the transverse colon. The polyp was sessile.       The polyp was removed with a cold biopsy forceps. Resection and       retrieval were complete.      Biopsies for histology were taken with a cold forceps from the right       colon and left colon for evaluation of microscopic colitis.  The digital rectal exam  was normal.      Non-bleeding internal hemorrhoids were found during retroflexion and       during anoscopy. The hemorrhoids were small and Grade I (internal       hemorrhoids that do not prolapse). Impression:           - Diverticulosis in the sigmoid colon, in the                        descending colon and in the distal transverse colon.                       - One 3 mm polyp in the transverse colon, removed with                        a cold biopsy forceps. Resected and retrieved.                       - Non-bleeding internal hemorrhoids.                       - Biopsies were taken with a cold forceps from the                        right colon and left colon for evaluation of                        microscopic colitis. Recommendation:       - Discharge patient to home.                       - Continue present medications. Procedure Code(s):    --- Professional ---                       [phone], Colonoscopy, flexible; with biopsy, single or                        multiple Diagnosis Code(s):    --- Professional ---                       K64.0, First degree hemorrhoids                       D12.3, Benign neoplasm of transverse colon (hepatic                        flexure or splenic flexure)                       K52.9, Noninfective gastroenteritis and colitis,                        unspecified                       R19.4, Change in bowel habit                       K57.30, Diverticulosis of large intestine without                        perforation or abscess without bleeding CPT copyright 2016  American Medical Association. All rights reserved. The codes documented in this report are preliminary and upon coder review may  be revised to meet current compliance requirements. Lollie Sails, MD 01/21/2016 10:51:19 AM This report has been signed electronically. Number of Addenda: 0 Note Initiated On: 01/21/2016 10:18 AM Scope Withdrawal Time: 0 hours 8 minutes 31 seconds  Total  Procedure Duration: 0 hours 20 minutes 27 seconds       [facility]

## 2016-01-21 NOTE — Transfer of Care (Signed)
Immediate Anesthesia Transfer of Care Note  Patient: Deborah Deleon  Procedure(s) Performed: Procedure(s): COLONOSCOPY WITH PROPOFOL (N/A)  Patient Location: PACU and Endoscopy Unit  Anesthesia Type:General  Level of Consciousness: sedated  Airway & Oxygen Therapy: Patient Spontanous Breathing and Patient connected to nasal cannula oxygen  Post-op Assessment: Report given to RN and Post -op Vital signs reviewed and stable  Post vital signs: Reviewed and stable  Last Vitals:  Vitals:   01/21/16 0843  BP: (!) 142/71  Pulse: 67  Resp: 16  Temp: 36.7 C    Last Pain: There were no vitals filed for this visit.       Complications: No apparent anesthesia complications

## 2016-01-21 NOTE — Telephone Encounter (Signed)
Form filled out and given to [place], CMA.

## 2016-01-21 NOTE — Anesthesia Preprocedure Evaluation (Signed)
Anesthesia Evaluation  Patient identified by MRN, date of birth, ID band Patient awake    Reviewed: Allergy & Precautions, H&P , NPO status , Patient's Chart, lab work & pertinent test results, reviewed documented beta blocker date and time   Airway Mallampati: II   Neck ROM: full    Dental  (+) Poor Dentition   Pulmonary neg pulmonary ROS,    Pulmonary exam normal        Cardiovascular hypertension, negative cardio ROS Normal cardiovascular exam Rhythm:regular Rate:Normal     Neuro/Psych negative neurological ROS  negative psych ROS   GI/Hepatic negative GI ROS, Neg liver ROS, GERD  Medicated,  Endo/Other  negative endocrine ROSHypothyroidism   Renal/GU negative Renal ROS  negative genitourinary   Musculoskeletal   Abdominal   Peds  Hematology negative hematology ROS (+)   Anesthesia Other Findings Past Medical History: No date: Allergy No date: Celiac disease No date: Chicken pox No date: Collagenous colitis No date: GERD (gastroesophageal reflux disease) No date: Hypertension No date: Hypothyroidism No date: Restless leg Past Surgical History: 1971: ABDOMINAL HYSTERECTOMY 1958: APPENDECTOMY 1979: CHOLECYSTECTOMY BMI    Body Mass Index:  29.12 kg/m     Reproductive/Obstetrics                             Anesthesia Physical Anesthesia Plan  ASA: III  Anesthesia Plan: General   Post-op Pain Management:    Induction:   Airway Management Planned:   Additional Equipment:   Intra-op Plan:   Post-operative Plan:   Informed Consent: I have reviewed the patients History and Physical, chart, labs and discussed the procedure including the risks, benefits and alternatives for the proposed anesthesia with the patient or authorized representative who has indicated his/her understanding and acceptance.   Dental Advisory Given  Plan Discussed with: CRNA  Anesthesia Plan  Comments:         Anesthesia Quick Evaluation

## 2016-01-21 NOTE — Telephone Encounter (Signed)
Pt called and told she needed to pick up form via last page on FMLA paperwork.

## 2016-01-21 NOTE — H&P (Signed)
Outpatient short stay form Pre-procedure 01/21/2016 10:09 AM Deborah Sails MD  Primary Physician: Jackelyn Poling PA  Reason for visit:  Colonoscopy  History of present illness:  Patient is a 81 year old female presenting today for colonoscopy. She does have a personal history of celiac sprue as well as collagenous colitis. She went off of her Delzicol several months ago. Her symptoms have gotten worse however she states she cannot afford the Delzicol. She has seen no blood in her stool has no abdominal pain. He tolerated her prep however did have some nausea and emesis yesterday with it.    Current Facility-Administered Medications:  .  0.9 %  sodium chloride infusion, , Intravenous, Continuous, Deborah Sails, MD, Last Rate: 20 mL/hr at 01/21/16 0912 .  0.9 %  sodium chloride infusion, , Intravenous, Continuous, Deborah Sails, MD  Prescriptions Prior to Admission  Medication Sig Dispense Refill Last Dose  . aspirin EC 81 MG tablet Take 81 mg by mouth daily.   Past Week at Unknown time  . furosemide (LASIX) 20 MG tablet Take 1 tablet (20 mg total) by mouth daily. 30 tablet 0 01/20/2016 at Unknown time  . levocetirizine (XYZAL) 5 MG tablet Take by mouth.   01/20/2016 at Unknown time  . levothyroxine (SYNTHROID, LEVOTHROID) 125 MCG tablet Take 1 tablet (125 mcg total) by mouth daily before breakfast. 90 tablet 0 01/21/2016 at 0600  . losartan (COZAAR) 25 MG tablet Take by mouth.   01/20/2016 at Unknown time  . montelukast (SINGULAIR) 10 MG tablet Take by mouth.   01/20/2016 at Unknown time  . pantoprazole (PROTONIX) 40 MG tablet Take by mouth.   01/20/2016 at Unknown time  . potassium chloride SA (K-DUR,KLOR-CON) 20 MEQ tablet Take 2 tablets (40 mEq total) by mouth daily. Take 2 tablets daily when on Furosemide. 30 tablet 0 01/20/2016 at Unknown time  . fluticasone (FLONASE) 50 MCG/ACT nasal spray Place into the nose.   Taking  . Mesalamine (ASACOL) 400 MG CPDR DR capsule Take by mouth.    Not Taking at Unknown time  . rOPINIRole (REQUIP) 0.5 MG tablet Take one in afternoon and 2 at bedtime.   Not Taking at Unknown time     Allergies  Allergen Reactions  . Penicillins Swelling  . Sulfa Antibiotics Rash     Past Medical History:  Diagnosis Date  . Allergy   . Celiac disease   . Chicken pox   . Collagenous colitis   . GERD (gastroesophageal reflux disease)   . Hypertension   . Hypothyroidism   . Restless leg     Review of systems:      Physical Exam    Heart and lungs: Regular rate and rhythm without rub or gallop, lungs are bilaterally clear.    HEENT: Normocephalic atraumatic eyes are anicteric    Other:     Pertinant exam for procedure: Soft nontender nondistended bowel sounds positive normoactive.    Planned proceedures: Colonoscopy and indicated procedures. I have discussed the risks benefits and complications of procedures to include not limited to bleeding, infection, perforation and the risk of sedation and the patient wishes to proceed.    Deborah Sails, MD Gastroenterology 01/21/2016  10:09 AM

## 2016-01-22 ENCOUNTER — Encounter: Payer: Self-pay | Admitting: Gastroenterology

## 2016-01-22 LAB — SURGICAL PATHOLOGY

## 2016-01-23 NOTE — Anesthesia Postprocedure Evaluation (Signed)
Anesthesia Post Note  Patient: Deborah Deleon  Procedure(s) Performed: Procedure(s) (LRB): COLONOSCOPY WITH PROPOFOL (N/A)  Patient location during evaluation: PACU Anesthesia Type: General Level of consciousness: awake and alert Pain management: pain level controlled Vital Signs Assessment: post-procedure vital signs reviewed and stable Respiratory status: spontaneous breathing, nonlabored ventilation, respiratory function stable and patient connected to nasal cannula oxygen Cardiovascular status: blood pressure returned to baseline and stable Postop Assessment: no signs of nausea or vomiting Anesthetic complications: no    Last Vitals:  Vitals:   01/21/16 1110 01/21/16 1120  BP: 134/69 129/74  Pulse: 73 74  Resp: 18 18  Temp:      Last Pain:  Vitals:   01/22/16 0749  TempSrc:   PainSc: 0-No pain                 Molli Barrows

## 2016-01-24 ENCOUNTER — Encounter
Admission: RE | Admit: 2016-01-24 | Discharge: 2016-01-24 | Disposition: A | Discharge status: Home / Self Care | Payer: PPO | Source: Ambulatory Visit | Attending: Cardiology | Admitting: Cardiology

## 2016-01-24 ENCOUNTER — Other Ambulatory Visit
Admission: RE | Admit: 2016-01-24 | Discharge: 2016-01-24 | Disposition: A | Discharge status: Home / Self Care | Payer: PPO | Source: Ambulatory Visit | Attending: Cardiology | Admitting: Cardiology

## 2016-01-24 ENCOUNTER — Other Ambulatory Visit: Payer: Self-pay | Admitting: Cardiology

## 2016-01-24 DIAGNOSIS — R079 Chest pain, unspecified: Secondary | ICD-10-CM | POA: Insufficient documentation

## 2016-01-24 DIAGNOSIS — Z01818 Encounter for other preprocedural examination: Secondary | ICD-10-CM

## 2016-01-24 DIAGNOSIS — R0602 Shortness of breath: Secondary | ICD-10-CM

## 2016-01-24 DIAGNOSIS — M7989 Other specified soft tissue disorders: Secondary | ICD-10-CM

## 2016-01-24 LAB — NM MYOCAR MULTI W/SPECT W/WALL MOTION / EF
Estimated workload: 1 METS
Exercise duration (min): 0 min
Exercise duration (sec): 0 s
LV dias vol: 57 mL (ref 46–106)
LV sys vol: 19 mL
MPHR: 141 {beats}/min
Peak HR: 107 {beats}/min
Percent HR: 75 %
Rest HR: 75 {beats}/min
SDS: 1
SRS: 2
SSS: 4
TID: 1.14

## 2016-01-24 LAB — BASIC METABOLIC PANEL
Anion gap: 6 (ref 5–15)
BUN: 15 mg/dL (ref 6–20)
CALCIUM: 9.4 mg/dL (ref 8.9–10.3)
CHLORIDE: 101 mmol/L (ref 101–111)
CO2: 26 mmol/L (ref 22–32)
CREATININE: 0.55 mg/dL (ref 0.44–1.00)
GFR calc Af Amer: >60 mL/min (ref >60)
GFR calc non Af Amer: >60 mL/min (ref >60)
Glucose, Bld: 109 mg/dL — ABNORMAL HIGH (ref 65–99)
Potassium: 3.9 mmol/L (ref 3.5–5.1)
SODIUM: 133 mmol/L — AB (ref 135–145)

## 2016-01-24 MED ORDER — TECHNETIUM TC 99M TETROFOSMIN IV KIT
33.0000 | PACK | Freq: Once | INTRAVENOUS | Status: AC | PRN
  Administered 2016-01-24: 31.419 via INTRAVENOUS

## 2016-01-24 MED ORDER — TECHNETIUM TC 99M TETROFOSMIN IV KIT
12.0000 | PACK | Freq: Once | INTRAVENOUS | Status: AC | PRN
  Administered 2016-01-24: 13.218 via INTRAVENOUS

## 2016-01-24 MED ORDER — REGADENOSON 0.4 MG/5ML IV SOLN
0.4000 mg | Freq: Once | INTRAVENOUS | Status: AC
  Administered 2016-01-24: 0.4 mg via INTRAVENOUS

## 2016-01-24 NOTE — Telephone Encounter (Signed)
Patient stated that her FMLA paper work was filled out wrong, she needed it to filled out to go to Doctor appt not be absent from work. She requested to [employer] paper work corrected and faxed back over if a copy is still available in the office. Pt contact [phone] fax [phone]  Attn:Brenda Janeice Robinson

## 2016-01-24 NOTE — Telephone Encounter (Signed)
Please advise, thanks.

## 2016-01-26 DIAGNOSIS — J069 Acute upper respiratory infection, unspecified: Secondary | ICD-10-CM | POA: Diagnosis not present

## 2016-01-28 ENCOUNTER — Encounter: Payer: Self-pay | Admitting: Family Medicine

## 2016-01-28 NOTE — Progress Notes (Signed)
Cardiology Office Note   Date:  02/05/2016   ID:  Deborah Deleon, DOB 1934/08/29, MRN [id number]  Referring Doctor:  Coral Spikes, DO   Cardiologist:   Wende Bushy, MD   Reason for consultation:  Chief Complaint  Patient presents with  . other    Discuss results. Meds reviewed verbally with pt.      History of Present Illness: Deborah Deleon is a 81 y.o. female who presents for Follow-up after testing  No recurrence of chest pain. Shortness breath as stabilized. Her leg swelling improved with the Lasix that has since recovered after discontinuation.  She does not have orthopnea, or PND. No palpitations, loss of consciousness. No abdominal pain.  ROS:  Please see the history of present illness. Aside from mentioned under HPI, all other systems are reviewed and negative.     Past Medical History:  Diagnosis Date  . Allergy   . Celiac disease   . Chicken pox   . Collagenous colitis   . GERD (gastroesophageal reflux disease)   . Hypertension   . Hypothyroidism   . Restless leg     Past Surgical History:  Procedure Laterality Date  . ABDOMINAL HYSTERECTOMY  1971  . APPENDECTOMY  1958  . CHOLECYSTECTOMY  1979  . COLONOSCOPY WITH PROPOFOL N/A 01/21/2016   Procedure: COLONOSCOPY WITH PROPOFOL;  Surgeon: Lollie Sails, MD;  Location: [facility] ENDOSCOPY;  Service: Endoscopy;  Laterality: N/A;     reports that she has never smoked. She has never used smokeless tobacco. She reports that she does not drink alcohol or use drugs.   family history includes Arthritis in her father; Breast cancer in her sister; Diabetes in her mother; Heart disease in her father and mother.   Outpatient Medications Prior to Visit  Medication Sig Dispense Refill  . aspirin EC 81 MG tablet Take 81 mg by mouth daily.    . fluticasone (FLONASE) 50 MCG/ACT nasal spray Place into the nose.    . levocetirizine (XYZAL) 5 MG tablet Take by mouth.    . levothyroxine (SYNTHROID, LEVOTHROID) 125  MCG tablet Take 1 tablet (125 mcg total) by mouth daily before breakfast. 90 tablet 0  . losartan (COZAAR) 25 MG tablet Take by mouth.    . Mesalamine (ASACOL) 400 MG CPDR DR capsule Take by mouth.    . montelukast (SINGULAIR) 10 MG tablet Take by mouth.    . pantoprazole (PROTONIX) 40 MG tablet Take by mouth.    Marland Kitchen rOPINIRole (REQUIP) 0.5 MG tablet Take one in afternoon and 2 at bedtime.    . furosemide (LASIX) 20 MG tablet Take 1 tablet (20 mg total) by mouth daily. (Patient not taking: Reported on 02/05/2016) 30 tablet 0  . potassium chloride SA (K-DUR,KLOR-CON) 20 MEQ tablet Take 2 tablets (40 mEq total) by mouth daily. Take 2 tablets daily when on Furosemide. (Patient not taking: Reported on 02/05/2016) 30 tablet 0   No facility-administered medications prior to visit.      Allergies: Penicillins and Sulfa antibiotics    PHYSICAL EXAM: VS:  BP 130/70 (BP Location: Left Arm, Patient Position: Sitting, Cuff Size: Normal)   Pulse 78   Ht 5' 5.5" (1.664 m)   Wt 172 lb 8 oz (78.2 kg)   BMI 28.27 kg/m  , Body mass index is 28.27 kg/m. Wt Readings from Last 3 Encounters:  02/05/16 172 lb 8 oz (78.2 kg)  01/21/16 175 lb (79.4 kg)  01/16/16 175 lb 1.9 oz (  79.4 kg)    GENERAL:  well developed, well nourished, not in acute distress HEENT: normocephalic, pink conjunctivae, anicteric sclerae, no xanthelasma, normal dentition, oropharynx clear NECK:  no neck vein engorgement, JVP normal, no hepatojugular reflux, carotid upstroke brisk and symmetric, no bruit, no thyromegaly, no lymphadenopathy LUNGS:  good respiratory effort, clear to auscultation bilaterally CV:  PMI not displaced, no thrills, no lifts, S1 and S2 within normal limits, no palpable S3 or S4, Soft systolic murmur noted, no rubs, no gallops ABD:  Soft, nontender, nondistended, normoactive bowel sounds, no abdominal aortic bruit, no hepatomegaly, no splenomegaly MS: nontender back, no kyphosis, no scoliosis, no joint  deformities EXT:  2+ DP/PT pulses, +1 edema, no varicosities, no cyanosis, no clubbing SKIN: warm, nondiaphoretic, normal turgor, no ulcers NEUROPSYCH: alert, oriented to person, place, and time, sensory/motor grossly intact, normal mood, appropriate affect  Recent Labs: 12/20/2015: ALT 11; Hemoglobin 11.7; Platelets 254; TSH 0.39 01/24/2016: BUN 15; Creatinine, Ser 0.55; Potassium 3.9; Sodium 133   Lipid Panel    Component Value Date/Time   CHOL 146 12/20/2015 1612   TRIG 211 (H) 12/20/2015 1612   HDL 30 (L) 12/20/2015 1612   CHOLHDL 4.9 12/20/2015 1612   VLDL 42 (H) 12/20/2015 1612   LDLCALC 74 12/20/2015 1612     Other studies Reviewed:  EKG:  The ekg from 01/16/2016 was personally reviewed by me and it revealed sinus rhythm, 91 BPM.  Additional studies/ records that were reviewed personally reviewed by me today include:   CT 11/28/2014: IMPRESSION: 1. Right upper lobe pulmonary nodule is stable and therefore considered benign. 2. Three-vessel coronary artery calcification   Echo 12/25/2015: Left ventricle: The cavity size was normal. Wall thickness was   normal. Systolic function was normal. The estimated ejection   fraction was in the range of 60% to 65%. Wall motion was normal;   there were no regional wall motion abnormalities. Left   ventricular diastolic function parameters were normal for the   patient&'s age. - Aortic valve: There was mild to moderate regurgitation. - Mitral valve: There was mild regurgitation.  Nuclear stress is 01/24/2016:  There was no ST segment deviation with Lexiscan.  The study is normal.  This is a low risk study.  The left ventricular ejection fraction is normal (55-65%).     ASSESSMENT AND PLAN: Shortness of breath Chest pain Coronary artery calcification Coronary artery calcification establish his diagnosis of CAD. No evidence of ischemia on pharmacologic nuclear stress test. Negative clinically significant CAD is low.  Patient reassured. Continue with risk factor modification.   Leg swelling Leg swelling and shortness of breath may point to congestive heart failure with preserved ejection fraction, likely acute on chronic No DVT on LTV. HCTZ was discontinued last time. May resume Lasix 20 once a day, potassium chloride 20 mEq once a day. Repeat BMP in 2 weeks. If kidney function is stable, may continue Lasix daily. And potassium daily. Follow-up in the office post surgery, likely in 6 weeks. Sodium restriction recommended. Daily weights. Patient call office if weight gain more than 2 pounds over 24 hours or 5 pounds in 1 week. Patient for light understanding.  Aortic insufficiency Mild to moderate aortic insufficiency, and no significant murmur noted. Blood pressure control recommended. Serial evaluation recommended.  Mitral regurgitation Mild MR noted on echo. Soft systolic murmur noted. Blood pressure control recommended. Serial evaluation recommended.  Hypertension BP is well controlled. Continue monitoring BP. Continue current medical therapy and lifestyle changes.  Preoperative evaluation  prior to possible left knee surgery Since no ischemia on stress testing, and otherwise no significant heart failure. We will resume Lasix. No indication for further cardiac testing at this point. Cardiac risk is intermediate risk for moderate risk surgery.   Current medicines are reviewed at length with the patient today.  The patient does not have concerns regarding medicines.  Labs/ tests ordered today include:  No orders of the defined types were placed in this encounter.   I had a lengthy and detailed discussion with the patient regarding diagnoses, prognosis, diagnostic options, treatment options , and side effects of medications.   I counseled the patient on importance of lifestyle modification including heart healthy diet, regular physical activity.  Disposition:   FU with undersigned in 6 weeks,  post surgery   Signed, Wende Bushy, MD  02/05/2016 3:21 PM    [place]  This note was generated in part with voice recognition software and I apologize for any typographical errors that were not detected and corrected.

## 2016-01-28 NOTE — Telephone Encounter (Signed)
Filled out

## 2016-01-28 NOTE — Telephone Encounter (Signed)
LVM to call office , or fax paperwork back over

## 2016-01-28 NOTE — Telephone Encounter (Addendum)
Patient requested to have FMLA paper work corrected from being absent from work 08/29-10/27. She requested FMLA paper work to be filled out so that she can go to Doctor appt.

## 2016-01-28 NOTE — Telephone Encounter (Signed)
Paperwork in your red [employer].

## 2016-01-28 NOTE — Telephone Encounter (Signed)
Where is paperwork now?

## 2016-01-29 ENCOUNTER — Telehealth: Payer: Self-pay | Admitting: Family Medicine

## 2016-01-29 NOTE — Telephone Encounter (Signed)
Ok. Left pt a vm to call and return my call. Thank you!

## 2016-01-29 NOTE — Telephone Encounter (Signed)
Please advise 

## 2016-01-29 NOTE — Telephone Encounter (Signed)
Pt called about the FMLA paperwork she picked up yesterday the date need to read unknown at this time. Pt wants to know if a copy can be faxed to her? Fax number [phone]. Thank you!

## 2016-01-29 NOTE — Telephone Encounter (Signed)
Please let pt know that the dates on the paperwork were the ones given before paperwork was filled out.

## 2016-01-29 NOTE — Telephone Encounter (Signed)
Pt came by office and was given the corrected form.

## 2016-01-30 ENCOUNTER — Ambulatory Visit: Payer: PPO

## 2016-01-30 DIAGNOSIS — R0602 Shortness of breath: Secondary | ICD-10-CM

## 2016-01-30 DIAGNOSIS — Z01818 Encounter for other preprocedural examination: Secondary | ICD-10-CM

## 2016-01-30 DIAGNOSIS — R079 Chest pain, unspecified: Secondary | ICD-10-CM

## 2016-01-30 DIAGNOSIS — M7989 Other specified soft tissue disorders: Secondary | ICD-10-CM | POA: Diagnosis not present

## 2016-01-31 ENCOUNTER — Telehealth: Payer: Self-pay | Admitting: Family Medicine

## 2016-01-31 NOTE — Telephone Encounter (Signed)
Patient has been notified

## 2016-01-31 NOTE — Telephone Encounter (Signed)
Is this appropriate action?Please advise.

## 2016-01-31 NOTE — Telephone Encounter (Signed)
Pt called about having a respitory infection. [facility] and was dx and given a Antibiotic which was Clarithromycin and Pro air inhaler and was told to get Robutussin. Pt is still having coughing spells really bad and chest congestion and mucus. Pt states this morning she has been having chills. No vomiting but gagging. No appt available to sch pt. I did offer pt Monday. Please advise?   Pharmacy is [facility], [place], [place]  Call pt @ [phone] Thank you!

## 2016-01-31 NOTE — Telephone Encounter (Signed)
I think this is appropriate.  She should give it some time. Will likely improve.

## 2016-02-04 ENCOUNTER — Ambulatory Visit: Payer: PPO | Admitting: Cardiology

## 2016-02-04 DIAGNOSIS — M2392 Unspecified internal derangement of left knee: Secondary | ICD-10-CM | POA: Diagnosis not present

## 2016-02-04 DIAGNOSIS — M1712 Unilateral primary osteoarthritis, left knee: Secondary | ICD-10-CM | POA: Diagnosis not present

## 2016-02-05 ENCOUNTER — Encounter: Payer: Self-pay | Admitting: Cardiology

## 2016-02-05 ENCOUNTER — Ambulatory Visit (INDEPENDENT_AMBULATORY_CARE_PROVIDER_SITE_OTHER): Payer: PPO | Admitting: Cardiology

## 2016-02-05 VITALS — BP 130/70 | HR 78 | Ht 65.5 in | Wt 172.5 lb

## 2016-02-05 DIAGNOSIS — I340 Nonrheumatic mitral (valve) insufficiency: Secondary | ICD-10-CM

## 2016-02-05 DIAGNOSIS — I351 Nonrheumatic aortic (valve) insufficiency: Secondary | ICD-10-CM

## 2016-02-05 DIAGNOSIS — Z01818 Encounter for other preprocedural examination: Secondary | ICD-10-CM | POA: Diagnosis not present

## 2016-02-05 DIAGNOSIS — M7989 Other specified soft tissue disorders: Secondary | ICD-10-CM | POA: Diagnosis not present

## 2016-02-05 DIAGNOSIS — I10 Essential (primary) hypertension: Secondary | ICD-10-CM

## 2016-02-05 DIAGNOSIS — R0602 Shortness of breath: Secondary | ICD-10-CM | POA: Diagnosis not present

## 2016-02-05 MED ORDER — POTASSIUM CHLORIDE CRYS ER 20 MEQ PO TBCR: 20.0000 meq | EXTENDED_RELEASE_TABLET | Freq: Every day | ORAL | 0 refills | Status: DC

## 2016-02-05 MED ORDER — FUROSEMIDE 20 MG PO TABS: 20.0000 mg | ORAL_TABLET | Freq: Every day | ORAL | 0 refills | Status: DC

## 2016-02-05 NOTE — Patient Instructions (Signed)
Medication Instructions:  Your physician has recommended you make the following change in your medication:  1. START Lasix 20 mg Once Daily 2. START Potassium 20 meq 1 tablet Once Daily while on Lasix   Labwork: Your physician recommends that you return for lab work in: 2 weeks and have your labs checked at the hospital. Go to the [facility] Entrance of the hospital to have that done. Make sure to take lab slip with you.    Follow-Up: Your physician recommends that you schedule a follow-up appointment in: 6 weeks with Dr. Yvone Neu.  It was a pleasure seeing you today here in the office. Please do not hesitate to give [place] a call back if you have any further questions. [place], BSN

## 2016-02-11 ENCOUNTER — Encounter
Admission: RE | Admit: 2016-02-11 | Discharge: 2016-02-11 | Disposition: A | Discharge status: Home / Self Care | Payer: PPO | Source: Ambulatory Visit | Attending: Orthopedic Surgery | Admitting: Orthopedic Surgery

## 2016-02-11 DIAGNOSIS — Z01818 Encounter for other preprocedural examination: Secondary | ICD-10-CM | POA: Insufficient documentation

## 2016-02-11 HISTORY — DX: Family history of other specified conditions: Z84.89

## 2016-02-11 NOTE — Patient Instructions (Signed)
  Your procedure is scheduled [id number] sept 25 , 2017. Report to Same Day Surgery. To find out your arrival time please call [phone] between 1PM - 3PM on Friday sept. 22, 2017 .  Remember: Instructions that are not followed completely may result in serious medical risk, up to and including death, or upon the discretion of your surgeon and anesthesiologist your surgery may need to be rescheduled.    _x___ 1. Do not eat food or drink liquids after midnight. No gum chewing or hard candies.     ____ 2. No Alcohol for 24 hours before or after surgery.   ____ 3. Bring all medications with you on the day of surgery if instructed.    __x__ 4. Notify your doctor if there is any change in your medical condition     (cold, fever, infections).    _____ 5. No smoking 24 hours prior to surgery.     Do not wear jewelry, make-up, hairpins, clips or nail polish.  Do not wear lotions, powders, or perfumes.   Do not shave 48 hours prior to surgery. Men may shave face and neck.  Do not bring valuables to the hospital.    [facility] is not responsible for any belongings or valuables.               Contacts, dentures or bridgework may not be worn into surgery.  Leave your suitcase in the car. After surgery it may be brought to your room.  For patients admitted to the hospital, discharge time is determined by your treatment team.   Patients discharged the day of surgery will not be allowed to drive home.    Please read over the following fact sheets that you were given:   [facility] Preparing for Surgery  _x___ Take these medicines the morning of surgery with A SIP OF WATER:    1. levothyroxine (SYNTHROID, LEVOTHROID)  2. montelukast (SINGULAIR)   3. pantoprazole (PROTONIX)   ____ Fleet Enema (as directed)   _x___ Use CHG Soap as directed on instruction sheet  ____ Use inhalers on the day of surgery and bring to hospital day of surgery  ____ Stop metformin 2 days prior to  surgery    ____ Take 1/2 of usual insulin dose the night before surgery and none on the morning of  surgery.   _x___ Stopped aspirin on 02/04/16.  _x___ Stop Anti-inflammatories such as Advil, Aleve, Ibuprofen, Motrin, Naproxen, Naprosyn, Goodies powders or aspirin products. OK to take Tylenol or Norco.   ____ Stop supplements until after surgery.    ____ Bring C-Pap to the hospital.

## 2016-02-13 ENCOUNTER — Telehealth: Payer: Self-pay | Admitting: Cardiology

## 2016-02-13 NOTE — Telephone Encounter (Signed)
Left voicemail message to call back to discuss.

## 2016-02-13 NOTE — Telephone Encounter (Signed)
Spoke with patient and she states that she is going to go on Friday the 29th to have her labs done because she is having surgery on 02/17/16. Let her know that should be fine and let her know that I would call with results. She verbalized understanding and had no further questions.

## 2016-02-13 NOTE — Telephone Encounter (Signed)
Pt is calling, states she is having surgery on 9/25, and will not be able to get her labs on 9/28 at [facility]. She asks if she can have them by the end of next week. Please call and advise.

## 2016-02-17 ENCOUNTER — Encounter: Payer: Self-pay | Admitting: Orthopedic Surgery

## 2016-02-17 ENCOUNTER — Ambulatory Visit
Admission: RE | Admit: 2016-02-17 | Discharge: 2016-02-17 | Disposition: A | Discharge status: Home / Self Care | Payer: PPO | Source: Ambulatory Visit | Attending: Orthopedic Surgery | Admitting: Orthopedic Surgery

## 2016-02-17 ENCOUNTER — Ambulatory Visit: Payer: PPO | Admitting: Certified Registered"

## 2016-02-17 ENCOUNTER — Encounter
Admission: RE | Disposition: A | Discharge status: Home / Self Care | Payer: Self-pay | Source: Ambulatory Visit | Attending: Orthopedic Surgery

## 2016-02-17 DIAGNOSIS — Z90710 Acquired absence of both cervix and uterus: Secondary | ICD-10-CM | POA: Insufficient documentation

## 2016-02-17 DIAGNOSIS — M1712 Unilateral primary osteoarthritis, left knee: Secondary | ICD-10-CM | POA: Diagnosis not present

## 2016-02-17 DIAGNOSIS — Z840 Family history of diseases of the skin and subcutaneous tissue: Secondary | ICD-10-CM | POA: Diagnosis not present

## 2016-02-17 DIAGNOSIS — I10 Essential (primary) hypertension: Secondary | ICD-10-CM | POA: Diagnosis not present

## 2016-02-17 DIAGNOSIS — M2392 Unspecified internal derangement of left knee: Secondary | ICD-10-CM | POA: Diagnosis not present

## 2016-02-17 DIAGNOSIS — M23252 Derangement of posterior horn of lateral meniscus due to old tear or injury, left knee: Secondary | ICD-10-CM | POA: Insufficient documentation

## 2016-02-17 DIAGNOSIS — Z833 Family history of diabetes mellitus: Secondary | ICD-10-CM | POA: Insufficient documentation

## 2016-02-17 DIAGNOSIS — Z882 Allergy status to sulfonamides status: Secondary | ICD-10-CM | POA: Insufficient documentation

## 2016-02-17 DIAGNOSIS — Z79899 Other long term (current) drug therapy: Secondary | ICD-10-CM | POA: Insufficient documentation

## 2016-02-17 DIAGNOSIS — Z8262 Family history of osteoporosis: Secondary | ICD-10-CM | POA: Diagnosis not present

## 2016-02-17 DIAGNOSIS — Z8249 Family history of ischemic heart disease and other diseases of the circulatory system: Secondary | ICD-10-CM | POA: Insufficient documentation

## 2016-02-17 DIAGNOSIS — Z9049 Acquired absence of other specified parts of digestive tract: Secondary | ICD-10-CM | POA: Diagnosis not present

## 2016-02-17 DIAGNOSIS — K589 Irritable bowel syndrome without diarrhea: Secondary | ICD-10-CM | POA: Insufficient documentation

## 2016-02-17 DIAGNOSIS — E039 Hypothyroidism, unspecified: Secondary | ICD-10-CM | POA: Insufficient documentation

## 2016-02-17 DIAGNOSIS — Z8261 Family history of arthritis: Secondary | ICD-10-CM | POA: Diagnosis not present

## 2016-02-17 DIAGNOSIS — Z880 Allergy status to penicillin: Secondary | ICD-10-CM | POA: Insufficient documentation

## 2016-02-17 DIAGNOSIS — S83282A Other tear of lateral meniscus, current injury, left knee, initial encounter: Secondary | ICD-10-CM | POA: Diagnosis not present

## 2016-02-17 DIAGNOSIS — K900 Celiac disease: Secondary | ICD-10-CM | POA: Diagnosis not present

## 2016-02-17 DIAGNOSIS — M94262 Chondromalacia, left knee: Secondary | ICD-10-CM | POA: Diagnosis not present

## 2016-02-17 DIAGNOSIS — M2242 Chondromalacia patellae, left knee: Secondary | ICD-10-CM | POA: Diagnosis not present

## 2016-02-17 DIAGNOSIS — G2581 Restless legs syndrome: Secondary | ICD-10-CM | POA: Diagnosis not present

## 2016-02-17 HISTORY — PX: KNEE ARTHROSCOPY: SHX127

## 2016-02-17 SURGERY — ARTHROSCOPY, KNEE
Anesthesia: General | Laterality: Left | Wound class: Clean

## 2016-02-17 MED ORDER — ACETAMINOPHEN 10 MG/ML IV SOLN
INTRAVENOUS | Status: DC | PRN
  Administered 2016-02-17: 1000 mg via INTRAVENOUS

## 2016-02-17 MED ORDER — BUPIVACAINE-EPINEPHRINE (PF) 0.25% -1:200000 IJ SOLN
INTRAMUSCULAR | Status: AC
  Filled 2016-02-17: qty 30

## 2016-02-17 MED ORDER — FENTANYL CITRATE (PF) 100 MCG/2ML IJ SOLN
25.0000 ug | INTRAMUSCULAR | Status: DC | PRN
  Administered 2016-02-17 (×4): 25 ug via INTRAVENOUS

## 2016-02-17 MED ORDER — ACETAMINOPHEN 10 MG/ML IV SOLN
INTRAVENOUS | Status: AC
  Filled 2016-02-17: qty 100

## 2016-02-17 MED ORDER — LACTATED RINGERS IV SOLN
INTRAVENOUS | Status: DC
  Administered 2016-02-17: 14:00:00 via INTRAVENOUS

## 2016-02-17 MED ORDER — FENTANYL CITRATE (PF) 100 MCG/2ML IJ SOLN
INTRAMUSCULAR | Status: DC | PRN
  Administered 2016-02-17: 25 ug via INTRAVENOUS

## 2016-02-17 MED ORDER — FENTANYL CITRATE (PF) 100 MCG/2ML IJ SOLN
INTRAMUSCULAR | Status: AC
  Filled 2016-02-17: qty 2

## 2016-02-17 MED ORDER — LIDOCAINE HCL (CARDIAC) 20 MG/ML IV SOLN
INTRAVENOUS | Status: DC | PRN
  Administered 2016-02-17: 80 mg via INTRAVENOUS

## 2016-02-17 MED ORDER — MIDAZOLAM HCL 2 MG/2ML IJ SOLN
INTRAMUSCULAR | Status: DC | PRN
  Administered 2016-02-17: 1 mg via INTRAVENOUS

## 2016-02-17 MED ORDER — HYDROCODONE-ACETAMINOPHEN 5-325 MG PO TABS: 1.0000 | ORAL_TABLET | ORAL | 0 refills | Status: DC | PRN

## 2016-02-17 MED ORDER — PROPOFOL 10 MG/ML IV BOLUS
INTRAVENOUS | Status: DC | PRN
  Administered 2016-02-17: 140 mg via INTRAVENOUS
  Administered 2016-02-17: 60 mg via INTRAVENOUS

## 2016-02-17 MED ORDER — CHLORHEXIDINE GLUCONATE 4 % EX LIQD: 60.0000 mL | Freq: Once | CUTANEOUS | Status: DC

## 2016-02-17 MED ORDER — ONDANSETRON HCL 4 MG/2ML IJ SOLN: 4.0000 mg | Freq: Once | INTRAMUSCULAR | Status: DC | PRN

## 2016-02-17 MED ORDER — SUCCINYLCHOLINE CHLORIDE 20 MG/ML IJ SOLN
INTRAMUSCULAR | Status: DC | PRN
  Administered 2016-02-17: 100 mg via INTRAVENOUS

## 2016-02-17 MED ORDER — ONDANSETRON HCL 4 MG/2ML IJ SOLN
INTRAMUSCULAR | Status: DC | PRN
  Administered 2016-02-17: 4 mg via INTRAVENOUS

## 2016-02-17 MED ORDER — DEXAMETHASONE SODIUM PHOSPHATE 10 MG/ML IJ SOLN
INTRAMUSCULAR | Status: DC | PRN
  Administered 2016-02-17: 10 mg via INTRAVENOUS

## 2016-02-17 MED ORDER — EPHEDRINE SULFATE 50 MG/ML IJ SOLN
INTRAMUSCULAR | Status: DC | PRN
  Administered 2016-02-17 (×2): 10 mg via INTRAVENOUS

## 2016-02-17 MED ORDER — MORPHINE SULFATE (PF) 4 MG/ML IV SOLN
INTRAVENOUS | Status: AC
  Filled 2016-02-17: qty 1

## 2016-02-17 SURGICAL SUPPLY — 24 items
BLADE SHAVER 4.5 DBL SERAT CV (CUTTER) ×3 IMPLANT
BNDG ESMARK 6X12 TAN STRL LF (GAUZE/BANDAGES/DRESSINGS) ×1 IMPLANT
CUFF TOURN 24 STER (MISCELLANEOUS) ×3 IMPLANT
CUFF TOURN 30 STER DUAL PORT (MISCELLANEOUS) ×1 IMPLANT
DRSG DERMACEA 8X12 NADH (GAUZE/BANDAGES/DRESSINGS) ×3 IMPLANT
DURAPREP 26ML APPLICATOR (WOUND CARE) ×6 IMPLANT
GAUZE SPONGE 4X4 12PLY STRL (GAUZE/BANDAGES/DRESSINGS) ×3 IMPLANT
GLOVE BIOGEL M STRL SZ7.5 (GLOVE) ×3 IMPLANT
GLOVE INDICATOR 8.0 STRL GRN (GLOVE) ×3 IMPLANT
GOWN STRL REUS W/ TWL LRG LVL3 (GOWN DISPOSABLE) ×2 IMPLANT
GOWN STRL REUS W/TWL LRG LVL3 (GOWN DISPOSABLE) ×6
IV LACTATED RINGER IRRG 3000ML (IV SOLUTION) ×18
IV LR IRRIG 3000ML ARTHROMATIC (IV SOLUTION) ×6 IMPLANT
KIT RM TURNOVER STRD PROC AR (KITS) ×3 IMPLANT
MANIFOLD NEPTUNE II (INSTRUMENTS) ×3 IMPLANT
PACK ARTHROSCOPY KNEE (MISCELLANEOUS) ×3 IMPLANT
SET TUBE SUCT SHAVER OUTFL 24K (TUBING) ×3 IMPLANT
SET TUBE TIP INTRA-ARTICULAR (MISCELLANEOUS) ×3 IMPLANT
SUT ETHILON 3-0 FS-10 30 BLK (SUTURE) ×3
SUTURE EHLN 3-0 FS-10 30 BLK (SUTURE) ×1 IMPLANT
TUBING ARTHRO INFLOW-ONLY STRL (TUBING) ×3 IMPLANT
WAND COBLATION FLOW 50 (SURGICAL WAND) ×2 IMPLANT
WAND HAND CNTRL MULTIVAC 50 (MISCELLANEOUS) ×1 IMPLANT
WRAP KNEE W/COLD PACKS 25.5X14 (SOFTGOODS) ×3 IMPLANT

## 2016-02-17 NOTE — Discharge Instructions (Signed)
°  Instructions after Knee Arthroscopy  ° ° Shandon Matson P. Kevionna Heffler, Jr., M.D.    ° Dept. of Orthopaedics & Sports Medicine ° [facility] ° [address] ° [place], [place]  [address] ° ° Phone: [phone]   Fax: [phone] ° ° °DIET: °• Drink plenty of non-alcoholic fluids & begin a light diet. °• Resume your normal diet the day after surgery. ° °ACTIVITY:  °• You may use crutches or a walker with weight-bearing as tolerated, unless instructed otherwise. °• You may wean yourself off of the walker or crutches as tolerated.  °• Begin doing gentle exercises. Exercising will reduce the pain and swelling, increase motion, and prevent muscle weakness.   °• Avoid strenuous activities or athletics for a minimum of 4-6 weeks after arthroscopic surgery. °• Do not drive or operate any equipment until instructed. ° °WOUND CARE:  °• Place one to two pillows under the knee the first day or two when sitting or lying.  °• Continue to use the ice packs periodically to reduce pain and swelling. °• The small incisions in your knee are closed with nylon stitches. The stitches will be removed in the office. °• The bulky dressing may be removed on the second day after surgery. DO NOT TOUCH THE STITCHES. Put a Band-Aid over each stitch. Do NOT use any ointments or creams on the incisions.  °• You may bathe or shower after the stitches are removed at the first office visit following surgery. ° °MEDICATIONS: °• You may resume your regular medications. °• Please take the pain medication as prescribed. °• Do not take pain medication on an empty stomach. °• Do not drive or drink alcoholic beverages when taking pain medications. ° °CALL THE OFFICE FOR: °• Temperature above 101 degrees °• Excessive bleeding or drainage on the dressing. °• Excessive swelling, coldness, or paleness of the toes. °• Persistent nausea and vomiting. ° °FOLLOW-UP:  °• You should have an appointment to return to the office in 7-10 days after surgery.  °  °

## 2016-02-17 NOTE — Op Note (Signed)
OPERATIVE NOTE  DATE OF SURGERY:  02/17/2016  PATIENT NAME:  Deborah Deleon   DOB: [birth year]  MRN: [id number]   PRE-OPERATIVE DIAGNOSIS:  Internal derangement of the left knee   POST-OPERATIVE DIAGNOSIS:   Tear of the posterior horn of the lateral meniscus, left knee Grade 4 chondromalacia of the lateral tibial plateau, left knee Grade 3 chondromalacia of the medial and patellofemoral compartments, left knee  PROCEDURE:  Left knee arthroscopy, partial lateral meniscectomy, and chondroplasty  SURGEON:  Marciano Sequin., M.D.   ASSISTANT: none  ANESTHESIA: general  ESTIMATED BLOOD LOSS: Minimal  FLUIDS REPLACED: 600 mL of crystalloid  TOURNIQUET TIME: Not used   DRAINS: none  IMPLANTS UTILIZED: None  INDICATIONS FOR SURGERY: Deborah Deleon is a 81 y.o. year old female who has been seen for complaints of left knee pain. MRI demonstrated findings consistent with meniscal pathology. After discussion of the risks and benefits of surgical intervention, the patient expressed understanding of the risks benefits and agree with plans for left knee arthroscopy.   PROCEDURE IN DETAIL: The patient was brought into the operating room and, after adequate general anesthesia was achieved, a tourniquet was applied to the left thigh and the leg was placed in the leg holder. All bony prominences were well padded. The patient's left knee was cleaned and prepped with alcohol and Duraprep and draped in the usual sterile fashion. A "timeout" was performed as per usual protocol. The anticipated portal sites were injected with 0.25% Marcaine with epinephrine. An anterolateral incision was made and a cannula was inserted. A small effusion was evacuated and the knee was distended with fluid using the pump. The scope was advanced down the medial gutter into the medial compartment. Under visualization with the scope, an anteromedial portal was created and a hooked probe was inserted. The medial meniscus was  visualized and probed. There was no appreciable tear to the medial meniscus and the meniscus was stable. The articular cartilage was visualized. Grade 2-3 chondromalacia was noted to the medial compartment. These areas were debrided and contoured using a 50 ArthroCare wand.  The scope was then advanced into the intercondylar notch. The anterior cruciate ligament was visualized and probed and felt to be intact. The scope was removed from the lateral portal and reinserted via the anteromedial portal to better visualize the lateral compartment. The lateral meniscus was visualized and probed. There was a complex tear of the posterior horn of the lateral meniscus. The tear was debrided using meniscal punches and a 4.5 mm shaver. Final contouring was performed using the 50 ArthroCare wand. Almost all of the posterior horn was resected. The anterior horn was visualized and felt to be stable. The articular cartilage of the lateral compartment was visualized. There were localized areas of grade 4 chondromalacia involving primarily the lateral tibial plateau. These areas were debrided and contoured using the 50 ArthroCare wand. Finally, the scope was advanced so as to visualize the patellofemoral articulation. Good patellar tracking was appreciated. Some fibrillation of the articular cartilage in the intercondylar groove was noted consistent with grade 3 chondromalacia. These areas were debrided using the 50 ArthroCare wand.  The knee was irrigated with copius amounts of fluid and suctioned dry. The anterolateral portal was re-approximated with #3-0 nylon. A combination of 0.25% Marcaine with epinephrine and 4 mg of Morphine were injected via the scope. The scope was removed and the anteromedial portal was re-approximated with #3-0 nylon. A sterile dressing was applied followed by application of an  ice wrap.  The patient tolerated the procedure well and was transported to the PACU in stable condition.  James P.  Holley Bouche., M.D.

## 2016-02-17 NOTE — Brief Op Note (Signed)
02/17/2016  5:37 PM  PATIENT:  Maureen Chatters  81 y.o. female  PRE-OPERATIVE DIAGNOSIS:  INTERNAL DERANGEMENT of the left knee  POST-OPERATIVE DIAGNOSIS:  INTERNAL DERANGEMENT,PRIMARY OSTEOARTHRITIS, Tear Posterior Horn lateral meniscus, Grade 4 Chondromalacia Lateral compartment,Grade 3 Chondromalacia Medial compartment and Patella Femoral  PROCEDURE:  Procedure(s): ARTHROSCOPY KNEE Chondraplasty (Left)  SURGEON:  Surgeon(s) and Role:    * Dereck Leep, MD - Primary  ASSISTANTS: none   ANESTHESIA:   general  EBL:  Minimal  BLOOD ADMINISTERED:none  DRAINS: none   LOCAL MEDICATIONS USED:  MARCAINE     SPECIMEN:  No Specimen  DISPOSITION OF SPECIMEN:  N/A  COUNTS:  YES  TOURNIQUET:   not used  DICTATION: .Sales executive  PLAN OF CARE: Discharge to home after PACU  PATIENT DISPOSITION:  PACU - hemodynamically stable.   Delay start of Pharmacological VTE agent (>24hrs) due to surgical blood loss or risk of bleeding: not applicable

## 2016-02-17 NOTE — Anesthesia Procedure Notes (Signed)
Procedure Name: LMA Insertion Date/Time: 02/17/2016 4:03 PM Performed by: Silvana Newness Pre-anesthesia Checklist: Patient identified, Emergency Drugs available, Suction available, Patient being monitored and Timeout performed Patient Re-evaluated:Patient Re-evaluated prior to inductionOxygen Delivery Method: Circle system utilized Preoxygenation: Pre-oxygenation with 100% oxygen Intubation Type: IV induction Ventilation: Mask ventilation without difficulty LMA: LMA inserted LMA Size: 4.0 Number of attempts: 3 Placement Confirmation: positive ETCO2 and breath sounds checked- equal and bilateral Tube secured with: Tape Dental Injury: Teeth and Oropharynx as per pre-operative assessment

## 2016-02-17 NOTE — Anesthesia Procedure Notes (Signed)
Procedure Name: Intubation Date/Time: 02/17/2016 4:08 PM Performed by: Silvana Newness Pre-anesthesia Checklist: Patient identified, Emergency Drugs available, Suction available, Patient being monitored and Timeout performed Patient Re-evaluated:Patient Re-evaluated prior to inductionOxygen Delivery Method: Circle system utilized Preoxygenation: Pre-oxygenation with 100% oxygen Intubation Type: IV induction Ventilation: Mask ventilation without difficulty Laryngoscope Size: Mac and 3 Grade View: Grade I Tube type: Oral Tube size: 7.0 mm Number of attempts: 1 Airway Equipment and Method: Rigid stylet Placement Confirmation: ETT inserted through vocal cords under direct vision,  positive ETCO2 and breath sounds checked- equal and bilateral Secured at: 19 cm Tube secured with: Tape Dental Injury: Teeth and Oropharynx as per pre-operative assessment  Comments: LMA removed due to inadequate seal and TV low after three attempts.  Converted to ETT.  Easy intubation. TV 500s, sats 100%, Breath sounds equal and bilateral

## 2016-02-17 NOTE — Transfer of Care (Signed)
Immediate Anesthesia Transfer of Care Note  Patient: Deborah Deleon  Procedure(s) Performed: Procedure(s): ARTHROSCOPY KNEE Chondraplasty (Left)  Patient Location: PACU  Anesthesia Type:General  Level of Consciousness: awake  Airway & Oxygen Therapy: Patient connected to face mask oxygen  Post-op Assessment: Post -op Vital signs reviewed and stable  Post vital signs: stable  Last Vitals:  Vitals:   02/17/16 1414 02/17/16 1738  BP: (!) 141/72 (!) 153/66  Pulse: 81 94  Resp: 18 20  Temp: 36.3 C 36.4 C    Last Pain:  Vitals:   02/17/16 1414  TempSrc: Tympanic  PainSc: 7          Complications: No apparent anesthesia complications

## 2016-02-17 NOTE — Anesthesia Preprocedure Evaluation (Addendum)
Anesthesia Evaluation  Patient identified by MRN, date of birth, ID band Patient awake    Reviewed: Allergy & Precautions, H&P , NPO status , Patient's Chart, lab work & pertinent test results, reviewed documented beta blocker date and time   History of Anesthesia Complications (+) Family history of anesthesia reaction  Airway Mallampati: II   Neck ROM: full    Dental  (+) Poor Dentition   Pulmonary neg pulmonary ROS,    Pulmonary exam normal        Cardiovascular hypertension, Pt. on medications negative cardio ROS Normal cardiovascular exam Rhythm:regular Rate:Normal     Neuro/Psych negative neurological ROS  negative psych ROS   GI/Hepatic negative GI ROS, Neg liver ROS, GERD  Medicated and Controlled,Celiac disease...collagenous colitis   Endo/Other  negative endocrine ROSHypothyroidism   Renal/GU negative Renal ROS  negative genitourinary   Musculoskeletal   Abdominal   Peds negative pediatric ROS (+)  Hematology negative hematology ROS (+)   Anesthesia Other Findings Past Medical History: No date: Allergy No date: Celiac disease No date: Chicken pox No date: Collagenous colitis No date: GERD (gastroesophageal reflux disease) No date: Hypertension No date: Hypothyroidism No date: Restless leg Past Surgical History: 1971: ABDOMINAL HYSTERECTOMY 1958: APPENDECTOMY 1979: CHOLECYSTECTOMY BMI    Body Mass Index:  29.12 kg/m     Reproductive/Obstetrics                             Anesthesia Physical  Anesthesia Plan  ASA: III  Anesthesia Plan: General   Post-op Pain Management:    Induction: Intravenous  Airway Management Planned: LMA  Additional Equipment:   Intra-op Plan:   Post-operative Plan:   Informed Consent: I have reviewed the patients History and Physical, chart, labs and discussed the procedure including the risks, benefits and alternatives for the  proposed anesthesia with the patient or authorized representative who has indicated his/her understanding and acceptance.   Dental Advisory Given  Plan Discussed with: CRNA and Surgeon  Anesthesia Plan Comments:        Anesthesia Quick Evaluation

## 2016-02-17 NOTE — H&P (Signed)
The patient has been re-examined, and the chart reviewed, and there have been no interval changes to the documented history and physical.    The risks, benefits, and alternatives have been discussed at length. The patient expressed understanding of the risks benefits and agreed with plans for surgical intervention.  Elfa Wooton P. Alycia Cooperwood, Jr. M.D.    

## 2016-02-18 ENCOUNTER — Encounter: Payer: Self-pay | Admitting: Orthopedic Surgery

## 2016-02-18 NOTE — Anesthesia Postprocedure Evaluation (Signed)
Anesthesia Post Note  Patient: Deborah Deleon  Procedure(s) Performed: Procedure(s) (LRB): ARTHROSCOPY KNEE Chondraplasty (Left)  Patient location during evaluation: PACU Anesthesia Type: General Level of consciousness: awake and alert Pain management: pain level controlled Vital Signs Assessment: post-procedure vital signs reviewed and stable Respiratory status: spontaneous breathing, nonlabored ventilation, respiratory function stable and patient connected to nasal cannula oxygen Cardiovascular status: blood pressure returned to baseline and stable Postop Assessment: no signs of nausea or vomiting Anesthetic complications: no    Last Vitals:  Vitals:   02/17/16 1935 02/17/16 1940  BP:  (!) 148/70  Pulse: (!) 105 87  Resp:  20  Temp:  36.6 C    Last Pain:  Vitals:   02/17/16 1940  TempSrc:   PainSc: 1                  Martha Clan

## 2016-02-19 ENCOUNTER — Other Ambulatory Visit: Payer: Self-pay | Admitting: Cardiology

## 2016-02-24 ENCOUNTER — Other Ambulatory Visit
Admission: RE | Admit: 2016-02-24 | Discharge: 2016-02-24 | Disposition: A | Discharge status: Home / Self Care | Payer: PPO | Source: Ambulatory Visit | Attending: Cardiology | Admitting: Cardiology

## 2016-02-24 DIAGNOSIS — R0602 Shortness of breath: Secondary | ICD-10-CM | POA: Diagnosis not present

## 2016-02-24 LAB — BASIC METABOLIC PANEL
Anion gap: 6 (ref 5–15)
BUN: 13 mg/dL (ref 6–20)
CALCIUM: 9.3 mg/dL (ref 8.9–10.3)
CHLORIDE: 92 mmol/L — AB (ref 101–111)
CO2: 33 mmol/L — AB (ref 22–32)
CREATININE: 0.62 mg/dL (ref 0.44–1.00)
GFR calc non Af Amer: >60 mL/min (ref >60)
Glucose, Bld: 107 mg/dL — ABNORMAL HIGH (ref 65–99)
POTASSIUM: 3.4 mmol/L — AB (ref 3.5–5.1)
SODIUM: 131 mmol/L — AB (ref 135–145)

## 2016-02-25 DIAGNOSIS — K900 Celiac disease: Secondary | ICD-10-CM | POA: Diagnosis not present

## 2016-02-25 DIAGNOSIS — K52831 Collagenous colitis: Secondary | ICD-10-CM | POA: Diagnosis not present

## 2016-02-26 ENCOUNTER — Telehealth: Payer: Self-pay | Admitting: *Deleted

## 2016-02-26 DIAGNOSIS — I10 Essential (primary) hypertension: Secondary | ICD-10-CM

## 2016-02-26 NOTE — Telephone Encounter (Signed)
-----   Message from Wende Bushy, MD sent at 02/25/2016 11:26 AM EDT ----- Is this blood work on both Lasix and potassium supplementation?

## 2016-02-26 NOTE — Telephone Encounter (Signed)
Spoke with patient and instructed her to take extra pill of potassium for 3 days and then we will schedule her to come in and recheck her labs. She verbalized understanding of instructions and appointment was made for 03/05/16. Let her know that I would call with those results as well. She had no further questions at this time.

## 2016-03-02 ENCOUNTER — Other Ambulatory Visit: Payer: Self-pay | Admitting: Family Medicine

## 2016-03-05 ENCOUNTER — Other Ambulatory Visit (INDEPENDENT_AMBULATORY_CARE_PROVIDER_SITE_OTHER): Payer: PPO | Admitting: *Deleted

## 2016-03-05 DIAGNOSIS — I10 Essential (primary) hypertension: Secondary | ICD-10-CM | POA: Diagnosis not present

## 2016-03-06 LAB — BASIC METABOLIC PANEL
BUN / CREAT RATIO: 18 (ref 12–28)
BUN: 12 mg/dL (ref 8–27)
CO2: 27 mmol/L (ref 18–29)
CREATININE: 0.65 mg/dL (ref 0.57–1.00)
Calcium: 9.7 mg/dL (ref 8.7–10.3)
Chloride: 95 mmol/L — ABNORMAL LOW (ref 96–106)
GFR calc Af Amer: 98 mL/min/{1.73_m2} (ref >59)
GFR calc non Af Amer: 85 mL/min/{1.73_m2} (ref >59)
GLUCOSE: 104 mg/dL — AB (ref 65–99)
Potassium: 4.2 mmol/L (ref 3.5–5.2)
Sodium: 136 mmol/L (ref 134–144)

## 2016-03-09 ENCOUNTER — Other Ambulatory Visit: Payer: Self-pay

## 2016-03-09 MED ORDER — POTASSIUM CHLORIDE CRYS ER 20 MEQ PO TBCR: 20.0000 meq | EXTENDED_RELEASE_TABLET | Freq: Every day | ORAL | 6 refills | Status: DC

## 2016-03-09 MED ORDER — FUROSEMIDE 20 MG PO TABS
20.0000 mg | ORAL_TABLET | Freq: Every day | ORAL | 6 refills | Status: DC
Start: 2016-03-09 — End: 2016-03-19

## 2016-03-19 ENCOUNTER — Encounter: Payer: Self-pay | Admitting: Cardiology

## 2016-03-19 ENCOUNTER — Ambulatory Visit (INDEPENDENT_AMBULATORY_CARE_PROVIDER_SITE_OTHER): Payer: PPO | Admitting: Cardiology

## 2016-03-19 VITALS — BP 120/81 | HR 91 | Ht 67.5 in | Wt 170.2 lb

## 2016-03-19 DIAGNOSIS — I340 Nonrheumatic mitral (valve) insufficiency: Secondary | ICD-10-CM | POA: Diagnosis not present

## 2016-03-19 DIAGNOSIS — M7989 Other specified soft tissue disorders: Secondary | ICD-10-CM

## 2016-03-19 DIAGNOSIS — I10 Essential (primary) hypertension: Secondary | ICD-10-CM | POA: Diagnosis not present

## 2016-03-19 DIAGNOSIS — I351 Nonrheumatic aortic (valve) insufficiency: Secondary | ICD-10-CM

## 2016-03-19 MED ORDER — POTASSIUM CHLORIDE ER 10 MEQ PO TBCR: 10.0000 meq | EXTENDED_RELEASE_TABLET | Freq: Every day | ORAL | 3 refills | Status: DC

## 2016-03-19 MED ORDER — FUROSEMIDE 20 MG PO TABS: 20.0000 mg | ORAL_TABLET | Freq: Every day | ORAL | 3 refills | Status: DC

## 2016-03-19 MED ORDER — POTASSIUM CHLORIDE ER 10 MEQ PO TBCR: 20.0000 meq | EXTENDED_RELEASE_TABLET | Freq: Every day | ORAL | 3 refills | Status: DC

## 2016-03-19 NOTE — Progress Notes (Signed)
Cardiology Office Note   Date:  03/19/2016   ID:  Deborah Deleon, DOB 1935-01-22, MRN [id number]  Referring Doctor:  Coral Spikes, DO   Cardiologist:   Wende Bushy, MD   Reason for consultation:  Chief Complaint  Patient presents with  . other    6 wk f/u pt requesting flonase and furosemide needs Rx. Meds reviewed verbally with pt.      History of Present Illness: Deborah Deleon is a 81 y.o. female who presents for Follow-up For swelling, hypertension  Since last visit, patient has been doing quite well. She thinks the dose of the Lasix is helping control the leg edema. She also went for a knee surgery recently and has recovered well postop. She will be undergoing physical therapy.  Patient denies chest pain, orthopnea, PND, syncope.   ROS:  Please see the history of present illness. Aside from mentioned under HPI, all other systems are reviewed and negative.     Past Medical History:  Diagnosis Date  . Allergy   . Celiac disease   . Chicken pox   . Collagenous colitis   . Family history of adverse reaction to anesthesia    daughter climbs the walls with anesthesia  . GERD (gastroesophageal reflux disease)   . Hypertension   . Hypothyroidism   . Restless leg     Past Surgical History:  Procedure Laterality Date  . ABDOMINAL HYSTERECTOMY  1971  . APPENDECTOMY  1958  . CATARACT EXTRACTION W/ INTRAOCULAR LENS IMPLANT Bilateral 2010   left done April and right done in May  . CHOLECYSTECTOMY  1979  . COLONOSCOPY WITH PROPOFOL N/A 01/21/2016   Procedure: COLONOSCOPY WITH PROPOFOL;  Surgeon: Lollie Sails, MD;  Location[facility] ENDOSCOPY;  Service: Endoscopy;  Laterality: N/A;  . KNEE ARTHROSCOPY Left 02/17/2016   Procedure: ARTHROSCOPY KNEE Chondraplasty;  Surgeon: Dereck Leep, MD;  Location: [facility] ORS;  Service: Orthopedics;  Laterality: Left;  . ROTATOR CUFF REPAIR Left 2008     reports that she has never smoked. She has never used smokeless tobacco.  She reports that she does not drink alcohol or use drugs.   family history includes Arthritis in her father; Breast cancer in her sister; Diabetes in her mother; Heart disease in her father and mother.   Outpatient Medications Prior to Visit  Medication Sig Dispense Refill  . aspirin EC 81 MG tablet Take 81 mg by mouth daily.    . fluticasone (FLONASE) 50 MCG/ACT nasal spray Place 2 sprays into the nose as needed.     . furosemide (LASIX) 20 MG tablet Take 1 tablet (20 mg total) by mouth daily. 30 tablet 6  . hydrochlorothiazide (MICROZIDE) 12.5 MG capsule Take 12.5 mg by mouth every morning.    Marland Kitchen HYDROcodone-acetaminophen (NORCO) 5-325 MG tablet Take 1-2 tablets by mouth every 4 (four) hours as needed for moderate pain. 50 tablet 0  . levocetirizine (XYZAL) 5 MG tablet Take by mouth every evening.     Marland Kitchen levothyroxine (SYNTHROID, LEVOTHROID) 125 MCG tablet TAKE ONE TABLET BY MOUTH EVERY MORNING BEFORE BREAKFAST 90 tablet 3  . meclizine (ANTIVERT) 12.5 MG tablet Take 12.5 mg by mouth 3 (three) times daily as needed for dizziness.    . montelukast (SINGULAIR) 10 MG tablet Take by mouth every morning.     . pantoprazole (PROTONIX) 40 MG tablet Take by mouth.    . potassium chloride SA (K-DUR,KLOR-CON) 20 MEQ tablet Take 1 tablet (20 mEq  total) by mouth daily. 30 tablet 6  . rOPINIRole (REQUIP) 0.5 MG tablet Take one in afternoon and 2 at bedtime.    Marland Kitchen HYDROcodone-acetaminophen (NORCO/VICODIN) 5-325 MG tablet Take 1 tablet by mouth every 4 (four) hours as needed for moderate pain.     No facility-administered medications prior to visit.      Allergies: Penicillins and Sulfa antibiotics    PHYSICAL EXAM: VS:  BP 120/81 (BP Location: Left Arm, Patient Position: Sitting, Cuff Size: Normal)   Pulse 91   Ht 5' 7.5" (1.715 m)   Wt 170 lb 4 oz (77.2 kg)   BMI 26.27 kg/m  , Body mass index is 26.27 kg/m. Wt Readings from Last 3 Encounters:  03/19/16 170 lb 4 oz (77.2 kg)  02/17/16 175 lb  (79.4 kg)  02/11/16 172 lb (78 kg)    GENERAL:  well developed, well nourished, not in acute distress HEENT: normocephalic, pink conjunctivae, anicteric sclerae, no xanthelasma, normal dentition, oropharynx clear NECK:  no neck vein engorgement, JVP normal, no hepatojugular reflux, carotid upstroke brisk and symmetric, no bruit, no thyromegaly, no lymphadenopathy LUNGS:  good respiratory effort, clear to auscultation bilaterally CV:  PMI not displaced, no thrills, no lifts, S1 and S2 within normal limits, no palpable S3 or S4, Soft systolic murmur noted, no rubs, no gallops ABD:  Soft, nontender, nondistended, normoactive bowel sounds, no abdominal aortic bruit, no hepatomegaly, no splenomegaly MS: nontender back, no kyphosis, no scoliosis, no joint deformities EXT:  2+ DP/PT pulses, +1 edema, no varicosities, no cyanosis, no clubbing SKIN: warm, nondiaphoretic, normal turgor, no ulcers NEUROPSYCH: alert, oriented to person, place, and time, sensory/motor grossly intact, normal mood, appropriate affect  Recent Labs: 12/20/2015: ALT 11; Hemoglobin 11.7; Platelets 254; TSH 0.39 03/05/2016: BUN 12; Creatinine, Ser 0.65; Potassium 4.2; Sodium 136   Lipid Panel    Component Value Date/Time   CHOL 146 12/20/2015 1612   TRIG 211 (H) 12/20/2015 1612   HDL 30 (L) 12/20/2015 1612   CHOLHDL 4.9 12/20/2015 1612   VLDL 42 (H) 12/20/2015 1612   LDLCALC 74 12/20/2015 1612     Other studies Reviewed:  EKG:  The ekg from 01/16/2016 was personally reviewed by me and it revealed sinus rhythm, 91 BPM.  Additional studies/ records that were reviewed personally reviewed by me today include:   CT 11/28/2014: IMPRESSION: 1. Right upper lobe pulmonary nodule is stable and therefore considered benign. 2. Three-vessel coronary artery calcification   Echo 12/25/2015: Left ventricle: The cavity size was normal. Wall thickness was   normal. Systolic function was normal. The estimated ejection   fraction  was in the range of 60% to 65%. Wall motion was normal;   there were no regional wall motion abnormalities. Left   ventricular diastolic function parameters were normal for the   patient&'s age. - Aortic valve: There was mild to moderate regurgitation. - Mitral valve: There was mild regurgitation.  Nuclear stress is 01/24/2016:  There was no ST segment deviation with Lexiscan.  The study is normal.  This is a low risk study.  The left ventricular ejection fraction is normal (55-65%).     ASSESSMENT AND PLAN: Shortness of breath Chest pain Coronary artery calcification Coronary artery calcification establish his diagnosis of CAD. No evidence of ischemia on pharmacologic nuclear stress test. Negative clinically significant CAD is low. Patient reassured. Continue with risk factor modification.   Leg swelling Leg swelling and shortness of breath may point to congestive heart failure with preserved ejection  fraction, likely acute on chronic No DVT on LTV. Patient tolerating Lasix 20 daily with potassium 20 mEq daily. We will switch to potassium 2 smaller pills. We will do a BMP in one month.  Sodium restriction recommended. Daily weights. Patient call office if weight gain more than 2 pounds over 24 hours or 5 pounds in 1 week.  Aortic insufficiency Mild to moderate aortic insufficiency, and no significant murmur noted. Blood pressure control recommended. Serial evaluation recommended.  Mitral regurgitation Mild MR noted on echo. Soft systolic murmur noted. Blood pressure control recommended. Serial evaluation recommended.  Hypertension BP is well controlled. Continue monitoring BP. Continue current medical therapy and lifestyle changes.  Current medicines are reviewed at length with the patient today.  The patient does not have concerns regarding medicines.  Labs/ tests ordered today include:  No orders of the defined types were placed in this encounter.   I had a lengthy  and detailed discussion with the patient regarding diagnoses, prognosis, diagnostic options, treatment options , and side effects of medications.   I counseled the patient on importance of lifestyle modification including heart healthy diet, regular physical activity.  Disposition:   FU with undersigned in 6 months Signed, Wende Bushy, MD  03/19/2016 3:45 PM    [place]  This note was generated in part with voice recognition software and I apologize for any typographical errors that were not detected and corrected.

## 2016-03-19 NOTE — Patient Instructions (Signed)
Medication Instructions:  Refills sent in for your medications. 1. NEW Potassium pills you will need to take 2 tablets once daily.   Follow-Up: Your physician wants you to follow-up in: 6 months with Dr. Yvone Neu. You will receive a reminder letter in the mail two months in advance. If you don't receive a letter, please call our office to schedule the follow-up appointment.  It was a pleasure seeing you today here in the office. Please do not hesitate to give [place] a call back if you have any further questions. [place], BSN

## 2016-03-20 ENCOUNTER — Telehealth: Payer: Self-pay | Admitting: Cardiology

## 2016-03-20 NOTE — Telephone Encounter (Signed)
-----   Message from Valora Corporal, RN sent at 03/19/2016  4:27 PM EDT ----- Could you call and schedule her for BMP in one month? I entered the order and Dr. Yvone Neu thought of it after she had left.   Thanks, Lesleigh Noe.

## 2016-03-20 NOTE — Telephone Encounter (Signed)
Called patient to make 56m labs She wanted to come on 04/24/16  Pt is coming on that day for her labs.

## 2016-03-25 DIAGNOSIS — H43813 Vitreous degeneration, bilateral: Secondary | ICD-10-CM | POA: Diagnosis not present

## 2016-04-09 ENCOUNTER — Telehealth: Payer: Self-pay | Admitting: Cardiology

## 2016-04-09 NOTE — Telephone Encounter (Signed)
Has she had any weight gain? She may take an extra pill for maximum of 3 days. If she is needing to take for more than that period of time, she may need to come in to be reevaluated. It looks like she has upcoming repeat BMP sometimes soon based on my note

## 2016-04-09 NOTE — Telephone Encounter (Signed)
Patient states that she has increased swelling since returning to work and she wanted to know if she could take additional fluid pill. Instructed her to wear compression hose and make sure to elevate her feet on 2 to 3 pillows when possible. Also let her know that I would check with Dr. Yvone Neu on the extra fluid pill and be in touch with her. She verbalized understanding and had no further questions at this time.

## 2016-04-09 NOTE — Telephone Encounter (Signed)
Left voicemail message to call back  

## 2016-04-09 NOTE — Telephone Encounter (Signed)
Patient returning call.

## 2016-04-09 NOTE — Telephone Encounter (Signed)
Pt calling stating we place her on Lasix  Pt c/o swelling: STAT is pt has developed SOB within 24 hours  1. How long have you been experiencing swelling? Today is her first day back to work from knee surgery and they were not as swollen then for she had it up for about 3 weeks  and now is back and they are swelling up   2. Where is the swelling located? Feet   3.  Are you currently taking a "fluid pill"? Yes   4.  Are you currently SOB? no  5.  Have you traveled recently? No

## 2016-04-09 NOTE — Telephone Encounter (Signed)
Patient states that she has not had any weight gain and no shortness of breath. Told her that she could take extra lasix but not for more than 3 days. She states that she is also going to get some compression socks as well. Let her know that she should give [place] a call if this continues to be a problem. She verbalized understanding and had no further questions at this time.

## 2016-04-13 ENCOUNTER — Other Ambulatory Visit: Payer: Self-pay | Admitting: Family Medicine

## 2016-04-13 DIAGNOSIS — Z1231 Encounter for screening mammogram for malignant neoplasm of breast: Secondary | ICD-10-CM

## 2016-04-24 ENCOUNTER — Other Ambulatory Visit: Payer: PPO

## 2016-04-24 ENCOUNTER — Other Ambulatory Visit (INDEPENDENT_AMBULATORY_CARE_PROVIDER_SITE_OTHER): Payer: PPO

## 2016-04-24 DIAGNOSIS — I10 Essential (primary) hypertension: Secondary | ICD-10-CM | POA: Diagnosis not present

## 2016-04-25 LAB — BASIC METABOLIC PANEL
BUN / CREAT RATIO: 17 (ref 12–28)
BUN: 11 mg/dL (ref 8–27)
CALCIUM: 9.3 mg/dL (ref 8.7–10.3)
CHLORIDE: 94 mmol/L — AB (ref 96–106)
CO2: 28 mmol/L (ref 18–29)
Creatinine, Ser: 0.65 mg/dL (ref 0.57–1.00)
GFR calc Af Amer: 98 mL/min/{1.73_m2} (ref >59)
GFR calc non Af Amer: 85 mL/min/{1.73_m2} (ref >59)
GLUCOSE: 96 mg/dL (ref 65–99)
Potassium: 4.4 mmol/L (ref 3.5–5.2)
Sodium: 136 mmol/L (ref 134–144)

## 2016-05-26 ENCOUNTER — Ambulatory Visit
Admission: RE | Admit: 2016-05-26 | Discharge: 2016-05-26 | Disposition: A | Discharge status: Home / Self Care | Payer: PPO | Source: Ambulatory Visit | Attending: Family Medicine | Admitting: Family Medicine

## 2016-05-26 DIAGNOSIS — R928 Other abnormal and inconclusive findings on diagnostic imaging of breast: Secondary | ICD-10-CM | POA: Diagnosis not present

## 2016-05-26 DIAGNOSIS — Z1231 Encounter for screening mammogram for malignant neoplasm of breast: Secondary | ICD-10-CM | POA: Insufficient documentation

## 2016-05-28 ENCOUNTER — Other Ambulatory Visit: Payer: Self-pay | Admitting: Family Medicine

## 2016-05-28 DIAGNOSIS — R928 Other abnormal and inconclusive findings on diagnostic imaging of breast: Secondary | ICD-10-CM

## 2016-05-28 DIAGNOSIS — N6320 Unspecified lump in the left breast, unspecified quadrant: Secondary | ICD-10-CM

## 2016-06-18 ENCOUNTER — Ambulatory Visit (INDEPENDENT_AMBULATORY_CARE_PROVIDER_SITE_OTHER): Payer: PPO | Admitting: Family Medicine

## 2016-06-18 ENCOUNTER — Encounter: Payer: Self-pay | Admitting: Family Medicine

## 2016-06-18 ENCOUNTER — Ambulatory Visit (INDEPENDENT_AMBULATORY_CARE_PROVIDER_SITE_OTHER): Payer: PPO

## 2016-06-18 VITALS — BP 156/81 | HR 81 | Temp 98.1°F | Wt 177.4 lb

## 2016-06-18 DIAGNOSIS — E039 Hypothyroidism, unspecified: Secondary | ICD-10-CM

## 2016-06-18 DIAGNOSIS — I10 Essential (primary) hypertension: Secondary | ICD-10-CM | POA: Diagnosis not present

## 2016-06-18 DIAGNOSIS — I2510 Atherosclerotic heart disease of native coronary artery without angina pectoris: Secondary | ICD-10-CM | POA: Insufficient documentation

## 2016-06-18 DIAGNOSIS — R05 Cough: Secondary | ICD-10-CM | POA: Diagnosis not present

## 2016-06-18 DIAGNOSIS — G2581 Restless legs syndrome: Secondary | ICD-10-CM

## 2016-06-18 DIAGNOSIS — I340 Nonrheumatic mitral (valve) insufficiency: Secondary | ICD-10-CM | POA: Insufficient documentation

## 2016-06-18 DIAGNOSIS — R053 Chronic cough: Secondary | ICD-10-CM

## 2016-06-18 DIAGNOSIS — I351 Nonrheumatic aortic (valve) insufficiency: Secondary | ICD-10-CM | POA: Insufficient documentation

## 2016-06-18 MED ORDER — PREDNISONE 50 MG PO TABS: ORAL_TABLET | ORAL | 0 refills | Status: DC

## 2016-06-18 MED ORDER — PANTOPRAZOLE SODIUM 40 MG PO TBEC: 40.0000 mg | DELAYED_RELEASE_TABLET | Freq: Every day | ORAL | 3 refills | Status: DC

## 2016-06-18 MED ORDER — FUROSEMIDE 20 MG PO TABS: 20.0000 mg | ORAL_TABLET | Freq: Every day | ORAL | 0 refills | Status: DC

## 2016-06-18 MED ORDER — FLUTICASONE PROPIONATE 50 MCG/ACT NA SUSP: 2.0000 | Freq: Every day | NASAL | 3 refills | Status: DC

## 2016-06-18 MED ORDER — HYDROCOD POLST-CPM POLST ER 10-8 MG/5ML PO SUER: 5.0000 mL | Freq: Two times a day (BID) | ORAL | 0 refills | Status: DC | PRN

## 2016-06-18 MED ORDER — MONTELUKAST SODIUM 10 MG PO TABS: 10.0000 mg | ORAL_TABLET | ORAL | 1 refills | Status: DC

## 2016-06-18 MED ORDER — ROPINIROLE HCL 1 MG PO TABS: ORAL_TABLET | ORAL | 0 refills | Status: DC

## 2016-06-18 MED ORDER — ROPINIROLE HCL 0.5 MG PO TABS: ORAL_TABLET | ORAL | 1 refills | Status: DC

## 2016-06-18 NOTE — Progress Notes (Signed)
Subjective:  Patient ID: Deborah Deleon, female    DOB: 03-26-1935  Age: 82 y.o. MRN: [id number]  CC: Cough, Restless leg  HPI:  82 year old with RLS, CAD, HTN, Hypothyroidism presents for follow up.  Cough  Patient reports that she has had cough since Nov.  Cough is nonproductive.  Associated post nasal drip.   No fever or SOB.  She has taken multiple OTC meds without significant improvement.  No known exacerbating factors.  No other associated symptoms.  Restless leg syndrome  Has been worsening as of late.  Interfering with sleep.  Current on requip.   Wants to discuss increase in dosage.  Social Hx   Social History   Social History  . Marital status: Widowed    Spouse name: N/A  . Number of children: N/A  . Years of education: N/A   Social History Main Topics  . Smoking status: Never Smoker  . Smokeless tobacco: Never Used  . Alcohol use No  . Drug use: No  . Sexual activity: Not Asked   Other Topics Concern  . None   Social History Narrative  . None   Review of Systems  Constitutional: Negative for fever.  HENT: Positive for postnasal drip.   Respiratory: Positive for cough.    Objective:  BP (!) 156/81   Pulse 81   Temp 98.1 F (36.7 C) (Oral)   Wt 177 lb 6.4 oz (80.5 kg)   SpO2 97%   BMI 27.37 kg/m   BP/Weight 06/18/2016 03/19/2016 0000000  Systolic BP [id number]  Diastolic BP 81 81 70  Wt. (Lbs) 177.4 170.25 175  BMI 27.37 26.27 28.68   Physical Exam  Constitutional: She is oriented to person, place, and time. She appears well-developed. No distress.  Cardiovascular: Normal rate and regular rhythm.   Pulmonary/Chest: Effort normal and breath sounds normal.  Neurological: She is alert and oriented to person, place, and time.  Psychiatric: She has a normal mood and affect.  Vitals reviewed.  Lab Results  Component Value Date   WBC 4.7 12/20/2015   HGB 11.7 12/20/2015   HCT 34.3 (L) 12/20/2015   PLT 254 12/20/2015   GLUCOSE 96 04/24/2016   CHOL 146 12/20/2015   TRIG 211 (H) 12/20/2015   HDL 30 (L) 12/20/2015   LDLCALC 74 12/20/2015   ALT 11 12/20/2015   AST 15 12/20/2015   NA 136 04/24/2016   K 4.4 04/24/2016   CL 94 (L) 04/24/2016   CREATININE 0.65 04/24/2016   BUN 11 04/24/2016   CO2 28 04/24/2016   TSH 0.39 (L) 12/20/2015   HGBA1C 5.6 12/20/2015    Assessment & Plan:   Problem List Items Addressed This Visit    Restless leg syndrome    Established problem, worsening. Increasing Requip.      Hypothyroidism   Relevant Orders   TSH   Chronic cough - Primary    New problem. Patient has had cough since November.  Exam remarkable. Chest xray negative.  Treating with prednisone and tussionex.      Relevant Orders   DG Chest 2 View (Completed)      Meds ordered this encounter  Medications  . DISCONTD: rOPINIRole (REQUIP) 0.5 MG tablet    Sig: Take one in afternoon and 2 at bedtime.    Dispense:  270 tablet    Refill:  1  . DISCONTD: furosemide (LASIX) 20 MG tablet    Sig: Take 20 mg by mouth daily.  Marland Kitchen  montelukast (SINGULAIR) 10 MG tablet    Sig: Take 1 tablet (10 mg total) by mouth every morning.    Dispense:  90 tablet    Refill:  1  . pantoprazole (PROTONIX) 40 MG tablet    Sig: Take 1 tablet (40 mg total) by mouth daily.    Dispense:  90 tablet    Refill:  3  . rOPINIRole (REQUIP) 1 MG tablet    Sig: 1 mg in the afternoon and 2 mg at night.    Dispense:  270 tablet    Refill:  0  . fluticasone (FLONASE) 50 MCG/ACT nasal spray    Sig: Place 2 sprays into both nostrils daily.    Dispense:  16 g    Refill:  3  . furosemide (LASIX) 20 MG tablet    Sig: Take 1 tablet (20 mg total) by mouth daily.    Dispense:  90 tablet    Refill:  0  . chlorpheniramine-HYDROcodone (TUSSIONEX PENNKINETIC ER) 10-8 MG/5ML SUER    Sig: Take 5 mLs by mouth every 12 (twelve) hours as needed.    Dispense:  115 mL    Refill:  0  . predniSONE (DELTASONE) 50 MG tablet    Sig: 1 tablet  daily x 5 days.    Dispense:  5 tablet    Refill:  0    Follow-up: 2 weeks  [place] DO [facility]

## 2016-06-18 NOTE — Progress Notes (Signed)
Pre visit review using our clinic review tool, if applicable. No additional management support is needed unless otherwise documented below in the visit note. 

## 2016-06-18 NOTE — Patient Instructions (Signed)
Increased Requip.  We will call with your chest xray results.  Call regarding your BP readings in 2 weeks.  Take care  Dr. Lacinda Axon

## 2016-06-18 NOTE — Assessment & Plan Note (Signed)
Established problem, worsening. Increasing Requip.

## 2016-06-18 NOTE — Assessment & Plan Note (Addendum)
New problem. Patient has had cough since November.  Exam remarkable. Chest xray negative.  Treating with prednisone and tussionex.

## 2016-06-19 ENCOUNTER — Ambulatory Visit
Admission: RE | Admit: 2016-06-19 | Discharge: 2016-06-19 | Disposition: A | Discharge status: Home / Self Care | Payer: PPO | Source: Ambulatory Visit | Attending: Family Medicine | Admitting: Family Medicine

## 2016-06-19 ENCOUNTER — Telehealth: Payer: Self-pay | Admitting: Family Medicine

## 2016-06-19 DIAGNOSIS — N6320 Unspecified lump in the left breast, unspecified quadrant: Secondary | ICD-10-CM

## 2016-06-19 DIAGNOSIS — R928 Other abnormal and inconclusive findings on diagnostic imaging of breast: Secondary | ICD-10-CM

## 2016-06-19 LAB — TSH: TSH: 0.61 u[IU]/mL (ref 0.35–4.50)

## 2016-06-19 NOTE — Telephone Encounter (Signed)
Pt called back in regards to her results. Thank you!  Call pt @ [phone]

## 2016-06-19 NOTE — Telephone Encounter (Signed)
Pt advised of results. 

## 2016-06-23 ENCOUNTER — Telehealth: Payer: Self-pay | Admitting: Family Medicine

## 2016-06-23 MED ORDER — LEVOTHYROXINE SODIUM 125 MCG PO TABS: 125.0000 ug | ORAL_TABLET | Freq: Every day | ORAL | 3 refills | Status: DC

## 2016-06-23 NOTE — Telephone Encounter (Signed)
Pt called back returning your call. Thank you!  Call pt @ [phone]

## 2016-06-23 NOTE — Telephone Encounter (Signed)
Pt called back and was given results. Pt asked for refill.

## 2016-06-23 NOTE — Telephone Encounter (Signed)
LVTCB

## 2016-07-02 ENCOUNTER — Ambulatory Visit (INDEPENDENT_AMBULATORY_CARE_PROVIDER_SITE_OTHER): Payer: PPO | Admitting: *Deleted

## 2016-07-02 VITALS — BP 130/64 | HR 68

## 2016-07-02 DIAGNOSIS — I10 Essential (primary) hypertension: Secondary | ICD-10-CM | POA: Diagnosis not present

## 2016-07-03 ENCOUNTER — Encounter: Payer: Self-pay | Admitting: *Deleted

## 2016-07-03 NOTE — Progress Notes (Signed)
Patient presented for 2 week BP follow up, patient was seated and meds reviewed before BP taken , patient had C/O bilateral edema to legs non-pitting when examined. Patient BP in left Arm sitting 7 to 8 minutes 140/70 pulse 68, waiting additional 5 to 7 minutes BP in right arm 130/64 Pulse 68. Patient C/o morning BP at home being 158/70 to 150/70 in left arm. Patient was questioned as to how she was taking BP and was advised by patient usually she is moving around house preparing for work, and sits down an d immediately takes BP advised patient she should rest before taking BP at least 5 minutes. Patient stated the edema lessens with elevation, and patient has a sedentary desk job, sitting for 8 hours no elevation.

## 2016-07-03 NOTE — Progress Notes (Signed)
Please have her follow up with me. No changes at this time.

## 2016-07-08 NOTE — Progress Notes (Signed)
Patient coming into office on 07/09/16

## 2016-07-10 ENCOUNTER — Encounter: Payer: Self-pay | Admitting: Family Medicine

## 2016-07-10 ENCOUNTER — Ambulatory Visit (INDEPENDENT_AMBULATORY_CARE_PROVIDER_SITE_OTHER): Payer: PPO | Admitting: Family Medicine

## 2016-07-10 DIAGNOSIS — I10 Essential (primary) hypertension: Secondary | ICD-10-CM | POA: Diagnosis not present

## 2016-07-10 DIAGNOSIS — I878 Other specified disorders of veins: Secondary | ICD-10-CM

## 2016-07-10 MED ORDER — FUROSEMIDE 20 MG PO TABS: 20.0000 mg | ORAL_TABLET | Freq: Every day | ORAL | 0 refills | Status: DC

## 2016-07-10 MED ORDER — MONTELUKAST SODIUM 10 MG PO TABS: 10.0000 mg | ORAL_TABLET | ORAL | 1 refills | Status: DC

## 2016-07-10 MED ORDER — FLUTICASONE PROPIONATE 50 MCG/ACT NA SUSP: 2.0000 | Freq: Every day | NASAL | 3 refills | Status: DC

## 2016-07-10 MED ORDER — POTASSIUM CHLORIDE ER 10 MEQ PO TBCR: 20.0000 meq | EXTENDED_RELEASE_TABLET | Freq: Every day | ORAL | 3 refills | Status: AC

## 2016-07-10 NOTE — Progress Notes (Signed)
Pre visit review using our clinic review tool, if applicable. No additional management support is needed unless otherwise documented below in the visit note. 

## 2016-07-10 NOTE — Progress Notes (Signed)
   Subjective:  Patient ID: Deborah Deleon, female    DOB: 1935/03/12  Age: 82 y.o. MRN: [id number]  CC: Follow up LE edema.  HPI:  82 year old female with CAD, HTN, LE edema presents for follow up.  LE edema  Secondary to venous stasis.  Wearing compression hose.  On lasix.  Improving.  Reports achy legs.  HTN  Has been stable.  BP elevated today.  Endorses compliance with Lasix.  Social Hx   Social History   Social History  . Marital status: Widowed    Spouse name: N/A  . Number of children: N/A  . Years of education: N/A   Social History Main Topics  . Smoking status: Never Smoker  . Smokeless tobacco: Never Used  . Alcohol use No  . Drug use: No  . Sexual activity: Not Asked   Other Topics Concern  . None   Social History Narrative  . None   Review of Systems  Cardiovascular: Positive for leg swelling.  Neurological: Positive for dizziness.   Objective:  BP (!) 143/84   Pulse 88   Temp 97.7 F (36.5 C) (Oral)   Wt 178 lb 12.8 oz (81.1 kg)   SpO2 96%   BMI 27.59 kg/m   BP/Weight 07/10/2016 07/02/2016 0000000  Systolic BP [id number]  Diastolic BP 84 64 81  Wt. (Lbs) 178.8 - 177.4  BMI 27.59 - 27.37   Physical Exam  Constitutional: She is oriented to person, place, and time. She appears well-developed. No distress.  Cardiovascular: Normal rate and regular rhythm.   Murmur heard. 1-2 + LE edema. Varicosity noted.  Pulmonary/Chest: Effort normal and breath sounds normal.  Neurological: She is alert and oriented to person, place, and time.  Psychiatric: She has a normal mood and affect.  Vitals reviewed.  Lab Results  Component Value Date   WBC 4.7 12/20/2015   HGB 11.7 12/20/2015   HCT 34.3 (L) 12/20/2015   PLT 254 12/20/2015   GLUCOSE 96 04/24/2016   CHOL 146 12/20/2015   TRIG 211 (H) 12/20/2015   HDL 30 (L) 12/20/2015   LDLCALC 74 12/20/2015   ALT 11 12/20/2015   AST 15 12/20/2015   NA 136 04/24/2016   K 4.4 04/24/2016   CL 94 (L) 04/24/2016   CREATININE 0.65 04/24/2016   BUN 11 04/24/2016   CO2 28 04/24/2016   TSH 0.61 06/18/2016   HGBA1C 5.6 12/20/2015    Assessment & Plan:   Problem List Items Addressed This Visit    Venous stasis    Stable. Continue compression and Lasix (per cardiology).      Essential hypertension    Has been stable. Monitor BP's at home. Lasix as prescribed.      Relevant Medications   furosemide (LASIX) 20 MG tablet      Follow-up: 6 months  [place] DO [facility]

## 2016-07-10 NOTE — Assessment & Plan Note (Signed)
Has been stable. Monitor BP's at home. Lasix as prescribed.

## 2016-07-10 NOTE — Assessment & Plan Note (Signed)
Stable. Continue compression and Lasix (per cardiology).

## 2016-07-10 NOTE — Patient Instructions (Signed)
Continue your meds.  Keep an eye on your BP.  Follow up in 6 months.  Take care  Dr. Lacinda Axon

## 2016-08-06 DIAGNOSIS — M25562 Pain in left knee: Secondary | ICD-10-CM | POA: Diagnosis not present

## 2016-08-06 DIAGNOSIS — M1712 Unilateral primary osteoarthritis, left knee: Secondary | ICD-10-CM | POA: Diagnosis not present

## 2016-08-28 ENCOUNTER — Other Ambulatory Visit: Payer: Self-pay | Admitting: Family Medicine

## 2016-08-28 DIAGNOSIS — L821 Other seborrheic keratosis: Secondary | ICD-10-CM | POA: Diagnosis not present

## 2016-08-28 DIAGNOSIS — X32XXXA Exposure to sunlight, initial encounter: Secondary | ICD-10-CM | POA: Diagnosis not present

## 2016-08-28 DIAGNOSIS — L570 Actinic keratosis: Secondary | ICD-10-CM | POA: Diagnosis not present

## 2016-08-28 MED ORDER — LEVOCETIRIZINE DIHYDROCHLORIDE 5 MG PO TABS: 5.0000 mg | ORAL_TABLET | Freq: Every evening | ORAL | 3 refills | Status: DC

## 2016-08-29 ENCOUNTER — Other Ambulatory Visit: Payer: Self-pay | Admitting: Family Medicine

## 2016-08-31 NOTE — Telephone Encounter (Signed)
This medication was discontinued from pts list. She is not taking a BP medication per her chart. Pt is on lasix. Please advise?

## 2016-09-15 ENCOUNTER — Encounter: Payer: Self-pay | Admitting: Cardiology

## 2016-09-15 ENCOUNTER — Ambulatory Visit (INDEPENDENT_AMBULATORY_CARE_PROVIDER_SITE_OTHER): Payer: PPO | Admitting: Cardiology

## 2016-09-15 VITALS — BP 140/80 | HR 71 | Ht 65.5 in | Wt 179.8 lb

## 2016-09-15 DIAGNOSIS — I351 Nonrheumatic aortic (valve) insufficiency: Secondary | ICD-10-CM

## 2016-09-15 DIAGNOSIS — I10 Essential (primary) hypertension: Secondary | ICD-10-CM

## 2016-09-15 DIAGNOSIS — M7989 Other specified soft tissue disorders: Secondary | ICD-10-CM | POA: Diagnosis not present

## 2016-09-15 DIAGNOSIS — I340 Nonrheumatic mitral (valve) insufficiency: Secondary | ICD-10-CM

## 2016-09-15 NOTE — Patient Instructions (Signed)
Labwork: Your physician recommends that you return for lab work. Go to [facility] and check in at the front desk to have labs done. No appointment is needed.    Follow-Up: Your physician wants you to follow-up in: 6 months. You will receive a reminder letter in the mail two months in advance. If you don't receive a letter, please call our office to schedule the follow-up appointment.  It was a pleasure seeing you today here in the office. Please do not hesitate to give [place] a call back if you have any further questions. [place], BSN

## 2016-09-15 NOTE — Progress Notes (Signed)
Cardiology Office Note   Date:  09/15/2016   ID:  Deborah Deleon, DOB 14-Apr-1935, MRN [id number]  Referring Doctor:  Coral Spikes, DO   Cardiologist:   Wende Bushy, MD   Reason for consultation:  Chief Complaint  Patient presents with  . other    6 mo. F/U. Pt c/o headaches with dizziness (mentioned symptom to PCP). Meds verbally reviewed with patient.        History of Present Illness: Deborah Deleon is a 82 y.o. female who presents for Follow-up For swelling, hypertension  Since last visit, pt feels well. Uses lasix prn for swelling. She feels her BP has been overall ok 673A or less systolic. No cp or sob  No pnd, orthopnea. Has been having frontal/bitemporal headaches even though bp is wnl. Some dizziness when that happens.   ROS:  Please see the history of present illness. Aside from mentioned under HPI, all other systems are reviewed and negative.       Past Medical History:  Diagnosis Date  . Allergy   . Celiac disease   . Chicken pox   . Collagenous colitis   . Family history of adverse reaction to anesthesia    daughter climbs the walls with anesthesia  . GERD (gastroesophageal reflux disease)   . Hypertension   . Hypothyroidism   . Restless leg     Past Surgical History:  Procedure Laterality Date  . ABDOMINAL HYSTERECTOMY  1971  . APPENDECTOMY  1958  . CATARACT EXTRACTION W/ INTRAOCULAR LENS IMPLANT Bilateral 2010   left done April and right done in May  . CHOLECYSTECTOMY  1979  . COLONOSCOPY WITH PROPOFOL N/A 01/21/2016   Procedure: COLONOSCOPY WITH PROPOFOL;  Surgeon: Lollie Sails, MD;  Location: [facility] ENDOSCOPY;  Service: Endoscopy;  Laterality: N/A;  . KNEE ARTHROSCOPY Left 02/17/2016   Procedure: ARTHROSCOPY KNEE Chondraplasty;  Surgeon: Dereck Leep, MD;  Location: [facility] ORS;  Service: Orthopedics;  Laterality: Left;  . ROTATOR CUFF REPAIR Left 2008     reports that she has never smoked. She has never used smokeless tobacco. She  reports that she does not drink alcohol or use drugs.   family history includes Arthritis in her father; Breast cancer in her sister; Diabetes in her mother; Heart disease in her father and mother.   Outpatient Medications Prior to Visit  Medication Sig Dispense Refill  . aspirin EC 81 MG tablet Take 81 mg by mouth daily.    . fluticasone (FLONASE) 50 MCG/ACT nasal spray Place 2 sprays into both nostrils daily. 16 g 3  . furosemide (LASIX) 20 MG tablet Take 1 tablet (20 mg total) by mouth daily. 90 tablet 0  . hydrochlorothiazide (MICROZIDE) 12.5 MG capsule TAKE ONE CAPSULE BY MOUTH EVERY DAY 90 capsule 3  . levocetirizine (XYZAL) 5 MG tablet Take 1 tablet (5 mg total) by mouth every evening. 90 tablet 3  . levothyroxine (SYNTHROID, LEVOTHROID) 125 MCG tablet Take 1 tablet (125 mcg total) by mouth daily with breakfast. 90 tablet 3  . montelukast (SINGULAIR) 10 MG tablet Take 1 tablet (10 mg total) by mouth every morning. 90 tablet 1  . pantoprazole (PROTONIX) 40 MG tablet Take 1 tablet (40 mg total) by mouth daily. 90 tablet 3  . potassium chloride (K-DUR) 10 MEQ tablet Take 2 tablets (20 mEq total) by mouth daily. 180 tablet 3  . rOPINIRole (REQUIP) 1 MG tablet 1 mg in the afternoon and 2 mg at night. [place]  tablet 0   No facility-administered medications prior to visit.      Allergies: Penicillins and Sulfa antibiotics    PHYSICAL EXAM: VS:  BP 140/80 (BP Location: Left Arm, Patient Position: Sitting, Cuff Size: Normal)   Pulse 71   Ht 5' 5.5" (1.664 m)   Wt 179 lb 12 oz (81.5 kg)   BMI 29.46 kg/m  , Body mass index is 29.46 kg/m. Wt Readings from Last 3 Encounters:  09/15/16 179 lb 12 oz (81.5 kg)  07/10/16 178 lb 12.8 oz (81.1 kg)  06/18/16 177 lb 6.4 oz (80.5 kg)    GENERAL:  well developed, well nourished, not in acute distress HEENT: normocephalic, pink conjunctivae, anicteric sclerae, no xanthelasma, normal dentition, oropharynx clear NECK:  no neck vein engorgement, JVP  normal, no hepatojugular reflux, carotid upstroke brisk and symmetric, no bruit, no thyromegaly, no lymphadenopathy LUNGS:  good respiratory effort, clear to auscultation bilaterally CV:  PMI not displaced, no thrills, no lifts, S1 and S2 within normal limits, no palpable S3 or S4, very soft systolic murmur, no rubs, no gallops ABD:  Soft, nontender, nondistended, normoactive bowel sounds, no abdominal aortic bruit, no hepatomegaly, no splenomegaly MS: nontender back, no kyphosis, no scoliosis, no joint deformities EXT:  2+ DP/PT pulses, no edema, no varicosities, no cyanosis, no clubbing SKIN: warm, nondiaphoretic, normal turgor, no ulcers NEUROPSYCH: alert, oriented to person, place, and time, sensory/motor grossly intact, normal mood, appropriate affect    Recent Labs: 12/20/2015: ALT 11; Hemoglobin 11.7; Platelets 254 04/24/2016: BUN 11; Creatinine, Ser 0.65; Potassium 4.4; Sodium 136 06/18/2016: TSH 0.61   Lipid Panel    Component Value Date/Time   CHOL 146 12/20/2015 1612   TRIG 211 (H) 12/20/2015 1612   HDL 30 (L) 12/20/2015 1612   CHOLHDL 4.9 12/20/2015 1612   VLDL 42 (H) 12/20/2015 1612   LDLCALC 74 12/20/2015 1612     Other studies Reviewed:  EKG:  The ekg from 01/16/2016 was personally reviewed by me and it revealed sinus rhythm, 91 BPM.  Additional studies/ records that were reviewed personally reviewed by me today include:   CT 11/28/2014: IMPRESSION: 1. Right upper lobe pulmonary nodule is stable and therefore considered benign. 2. Three-vessel coronary artery calcification   Echo 12/25/2015: Left ventricle: The cavity size was normal. Wall thickness was   normal. Systolic function was normal. The estimated ejection   fraction was in the range of 60% to 65%. Wall motion was normal;   there were no regional wall motion abnormalities. Left   ventricular diastolic function parameters were normal for the   patient&'s age. - Aortic valve: There was mild to moderate  regurgitation. - Mitral valve: There was mild regurgitation.  Nuclear stress is 01/24/2016:  There was no ST segment deviation with Lexiscan.  The study is normal.  This is a low risk study.  The left ventricular ejection fraction is normal (55-65%).     ASSESSMENT AND PLAN: Shortness of breath Chest pain Coronary artery calcification Discussion/recommendation similar to 03/19/2016: Coronary artery calcification establish diagnosis of atherosclerosis No evidence of ischemia on pharmacologic nuclear stress test. Negative clinically significant CAD is low. Patient reassured. Continue with risk factor modification.   Leg swelling Controlled with prn lasix. Check bmp. Avoid prolonged standing.   Aortic insufficiency Mild to moderate aortic insufficiency, and no significant murmur noted. Blood pressure control recommended. Serial evaluation recommended. Consider echo on next visit.    Mitral regurgitation Mild MR noted on echo. Soft systolic murmur noted. Blood  pressure control recommended. Serial evaluation recommended. Consider echo on next visit.   Hypertension BP is well controlled. Continue monitoring BP. Continue current medical therapy and lifestyle changes.   Current medicines are reviewed at length with the patient today.  The patient does not have concerns regarding medicines.  Labs/ tests ordered today include:  Orders Placed This Encounter  Procedures  . Basic metabolic panel  . EKG 12-Lead    I had a lengthy and detailed discussion with the patient regarding diagnoses, prognosis, diagnostic options, treatment options , and side effects of medications.   I counseled the patient on importance of lifestyle modification including heart healthy diet, regular physical activity.  Disposition:   FU with cardiology 6 months  I spent at least 25 minutes with the patient today and more than 50% of the time was spent counseling the patient and coordinating care.        Signed, Wende Bushy, MD  09/15/2016 11:51 PM    [place]  This note was generated in part with voice recognition software and I apologize for any typographical errors that were not detected and corrected.

## 2016-09-25 DIAGNOSIS — M1712 Unilateral primary osteoarthritis, left knee: Secondary | ICD-10-CM | POA: Diagnosis not present

## 2016-09-26 ENCOUNTER — Other Ambulatory Visit
Admission: RE | Admit: 2016-09-26 | Discharge: 2016-09-26 | Disposition: A | Discharge status: Home / Self Care | Payer: PPO | Source: Ambulatory Visit | Attending: Cardiology | Admitting: Cardiology

## 2016-09-26 DIAGNOSIS — R0602 Shortness of breath: Secondary | ICD-10-CM | POA: Insufficient documentation

## 2016-09-26 LAB — BASIC METABOLIC PANEL
Anion gap: 5 (ref 5–15)
BUN: 14 mg/dL (ref 6–20)
CHLORIDE: 95 mmol/L — AB (ref 101–111)
CO2: 31 mmol/L (ref 22–32)
CREATININE: 0.53 mg/dL (ref 0.44–1.00)
Calcium: 9 mg/dL (ref 8.9–10.3)
GFR calc Af Amer: >60 mL/min (ref >60)
GLUCOSE: 109 mg/dL — AB (ref 65–99)
Potassium: 3.8 mmol/L (ref 3.5–5.1)
SODIUM: 131 mmol/L — AB (ref 135–145)

## 2016-10-01 DIAGNOSIS — K52831 Collagenous colitis: Secondary | ICD-10-CM | POA: Diagnosis not present

## 2016-10-01 DIAGNOSIS — K900 Celiac disease: Secondary | ICD-10-CM | POA: Diagnosis not present

## 2016-10-02 DIAGNOSIS — M1712 Unilateral primary osteoarthritis, left knee: Secondary | ICD-10-CM | POA: Diagnosis not present

## 2016-10-09 DIAGNOSIS — M1712 Unilateral primary osteoarthritis, left knee: Secondary | ICD-10-CM | POA: Diagnosis not present

## 2016-10-13 DIAGNOSIS — T50995A Adverse effect of other drugs, medicaments and biological substances, initial encounter: Secondary | ICD-10-CM | POA: Diagnosis not present

## 2017-02-16 DIAGNOSIS — R0683 Snoring: Secondary | ICD-10-CM | POA: Diagnosis not present

## 2017-02-16 DIAGNOSIS — E039 Hypothyroidism, unspecified: Secondary | ICD-10-CM | POA: Diagnosis not present

## 2017-02-16 DIAGNOSIS — J3089 Other allergic rhinitis: Secondary | ICD-10-CM | POA: Diagnosis not present

## 2017-02-16 DIAGNOSIS — R400 Somnolence: Secondary | ICD-10-CM | POA: Diagnosis not present

## 2017-02-16 DIAGNOSIS — R05 Cough: Secondary | ICD-10-CM | POA: Diagnosis not present

## 2017-02-16 DIAGNOSIS — I2510 Atherosclerotic heart disease of native coronary artery without angina pectoris: Secondary | ICD-10-CM | POA: Diagnosis not present

## 2017-02-16 DIAGNOSIS — G2581 Restless legs syndrome: Secondary | ICD-10-CM | POA: Diagnosis not present

## 2017-02-16 DIAGNOSIS — Z79899 Other long term (current) drug therapy: Secondary | ICD-10-CM | POA: Diagnosis not present

## 2017-02-16 DIAGNOSIS — K219 Gastro-esophageal reflux disease without esophagitis: Secondary | ICD-10-CM | POA: Diagnosis not present

## 2017-02-16 DIAGNOSIS — E669 Obesity, unspecified: Secondary | ICD-10-CM | POA: Diagnosis not present

## 2017-02-16 DIAGNOSIS — Z0001 Encounter for general adult medical examination with abnormal findings: Secondary | ICD-10-CM | POA: Diagnosis not present

## 2017-02-16 DIAGNOSIS — I10 Essential (primary) hypertension: Secondary | ICD-10-CM | POA: Diagnosis not present

## 2017-02-16 DIAGNOSIS — R600 Localized edema: Secondary | ICD-10-CM | POA: Diagnosis not present

## 2017-02-16 DIAGNOSIS — Z7689 Persons encountering health services in other specified circumstances: Secondary | ICD-10-CM | POA: Diagnosis not present

## 2017-02-18 DIAGNOSIS — I10 Essential (primary) hypertension: Secondary | ICD-10-CM | POA: Diagnosis not present

## 2017-02-18 DIAGNOSIS — I340 Nonrheumatic mitral (valve) insufficiency: Secondary | ICD-10-CM | POA: Diagnosis not present

## 2017-02-18 DIAGNOSIS — I351 Nonrheumatic aortic (valve) insufficiency: Secondary | ICD-10-CM | POA: Diagnosis not present

## 2017-02-18 DIAGNOSIS — E785 Hyperlipidemia, unspecified: Secondary | ICD-10-CM | POA: Insufficient documentation

## 2017-02-22 DIAGNOSIS — M2392 Unspecified internal derangement of left knee: Secondary | ICD-10-CM | POA: Diagnosis not present

## 2017-02-22 DIAGNOSIS — M25562 Pain in left knee: Secondary | ICD-10-CM | POA: Diagnosis not present

## 2017-02-23 ENCOUNTER — Other Ambulatory Visit: Payer: Self-pay | Admitting: Physician Assistant

## 2017-02-23 DIAGNOSIS — M2392 Unspecified internal derangement of left knee: Secondary | ICD-10-CM

## 2017-02-25 ENCOUNTER — Other Ambulatory Visit: Payer: Self-pay | Admitting: Gastroenterology

## 2017-02-25 ENCOUNTER — Ambulatory Visit
Admission: RE | Admit: 2017-02-25 | Discharge: 2017-02-25 | Disposition: A | Discharge status: Home / Self Care | Payer: PPO | Source: Ambulatory Visit | Attending: Physician Assistant | Admitting: Physician Assistant

## 2017-02-25 DIAGNOSIS — R1310 Dysphagia, unspecified: Secondary | ICD-10-CM | POA: Diagnosis not present

## 2017-02-25 DIAGNOSIS — M25562 Pain in left knee: Secondary | ICD-10-CM | POA: Insufficient documentation

## 2017-02-25 DIAGNOSIS — M948X6 Other specified disorders of cartilage, lower leg: Secondary | ICD-10-CM | POA: Insufficient documentation

## 2017-02-25 DIAGNOSIS — K900 Celiac disease: Secondary | ICD-10-CM | POA: Diagnosis not present

## 2017-02-25 DIAGNOSIS — S83272A Complex tear of lateral meniscus, current injury, left knee, initial encounter: Secondary | ICD-10-CM | POA: Diagnosis not present

## 2017-02-25 DIAGNOSIS — R1319 Other dysphagia: Secondary | ICD-10-CM

## 2017-02-25 DIAGNOSIS — S83242A Other tear of medial meniscus, current injury, left knee, initial encounter: Secondary | ICD-10-CM | POA: Insufficient documentation

## 2017-02-25 DIAGNOSIS — M2392 Unspecified internal derangement of left knee: Secondary | ICD-10-CM

## 2017-02-25 DIAGNOSIS — K52831 Collagenous colitis: Secondary | ICD-10-CM | POA: Diagnosis not present

## 2017-03-02 ENCOUNTER — Other Ambulatory Visit
Admission: RE | Admit: 2017-03-02 | Discharge: 2017-03-02 | Disposition: A | Discharge status: Home / Self Care | Payer: PPO | Source: Ambulatory Visit | Attending: Gastroenterology | Admitting: Gastroenterology

## 2017-03-02 ENCOUNTER — Ambulatory Visit
Admission: RE | Admit: 2017-03-02 | Discharge: 2017-03-02 | Disposition: A | Discharge status: Home / Self Care | Payer: PPO | Source: Ambulatory Visit | Attending: Gastroenterology | Admitting: Gastroenterology

## 2017-03-02 ENCOUNTER — Ambulatory Visit: Payer: PPO

## 2017-03-02 DIAGNOSIS — R1319 Other dysphagia: Secondary | ICD-10-CM

## 2017-03-02 DIAGNOSIS — R1314 Dysphagia, pharyngoesophageal phase: Secondary | ICD-10-CM | POA: Diagnosis not present

## 2017-03-02 DIAGNOSIS — K449 Diaphragmatic hernia without obstruction or gangrene: Secondary | ICD-10-CM | POA: Diagnosis not present

## 2017-03-02 DIAGNOSIS — K228 Other specified diseases of esophagus: Secondary | ICD-10-CM | POA: Insufficient documentation

## 2017-03-02 DIAGNOSIS — R1310 Dysphagia, unspecified: Secondary | ICD-10-CM | POA: Insufficient documentation

## 2017-03-02 LAB — GASTROINTESTINAL PANEL BY PCR, STOOL (REPLACES STOOL CULTURE)

## 2017-03-02 LAB — C DIFFICILE QUICK SCREEN W PCR REFLEX
C DIFFICILE (CDIFF) TOXIN: NEGATIVE
C DIFFICLE (CDIFF) ANTIGEN: NEGATIVE
C Diff interpretation: NOT DETECTED

## 2017-03-04 DIAGNOSIS — K449 Diaphragmatic hernia without obstruction or gangrene: Secondary | ICD-10-CM | POA: Diagnosis not present

## 2017-03-04 DIAGNOSIS — K52831 Collagenous colitis: Secondary | ICD-10-CM | POA: Diagnosis not present

## 2017-03-04 DIAGNOSIS — K900 Celiac disease: Secondary | ICD-10-CM | POA: Diagnosis not present

## 2017-03-04 DIAGNOSIS — R1310 Dysphagia, unspecified: Secondary | ICD-10-CM | POA: Diagnosis not present

## 2017-03-14 ENCOUNTER — Other Ambulatory Visit: Payer: Self-pay | Admitting: Family Medicine

## 2017-03-19 DIAGNOSIS — G4733 Obstructive sleep apnea (adult) (pediatric): Secondary | ICD-10-CM | POA: Diagnosis not present

## 2017-03-22 DIAGNOSIS — Z9989 Dependence on other enabling machines and devices: Secondary | ICD-10-CM

## 2017-03-22 DIAGNOSIS — G4733 Obstructive sleep apnea (adult) (pediatric): Secondary | ICD-10-CM | POA: Insufficient documentation

## 2017-03-25 DIAGNOSIS — K449 Diaphragmatic hernia without obstruction or gangrene: Secondary | ICD-10-CM | POA: Diagnosis not present

## 2017-03-30 DIAGNOSIS — M1712 Unilateral primary osteoarthritis, left knee: Secondary | ICD-10-CM | POA: Diagnosis not present

## 2017-04-04 DIAGNOSIS — M1712 Unilateral primary osteoarthritis, left knee: Secondary | ICD-10-CM | POA: Insufficient documentation

## 2017-04-07 ENCOUNTER — Encounter
Admission: RE | Admit: 2017-04-07 | Discharge: 2017-04-07 | Disposition: A | Discharge status: Home / Self Care | Payer: PPO | Source: Ambulatory Visit | Attending: Orthopedic Surgery | Admitting: Orthopedic Surgery

## 2017-04-07 ENCOUNTER — Other Ambulatory Visit: Payer: Self-pay

## 2017-04-07 DIAGNOSIS — Z01810 Encounter for preprocedural cardiovascular examination: Secondary | ICD-10-CM | POA: Insufficient documentation

## 2017-04-07 DIAGNOSIS — I10 Essential (primary) hypertension: Secondary | ICD-10-CM | POA: Diagnosis not present

## 2017-04-07 DIAGNOSIS — Z01812 Encounter for preprocedural laboratory examination: Secondary | ICD-10-CM | POA: Insufficient documentation

## 2017-04-07 HISTORY — DX: Cortical age-related cataract, unspecified eye: H25.019

## 2017-04-07 HISTORY — DX: Unspecified osteoarthritis, unspecified site: M19.90

## 2017-04-07 HISTORY — DX: Diverticulosis of intestine, part unspecified, without perforation or abscess without bleeding: K57.90

## 2017-04-07 LAB — COMPREHENSIVE METABOLIC PANEL
ALT: 19 U/L (ref 14–54)
ANION GAP: 8 (ref 5–15)
AST: 20 U/L (ref 15–41)
Albumin: 3.7 g/dL (ref 3.5–5.0)
Alkaline Phosphatase: 83 U/L (ref 38–126)
BUN: 9 mg/dL (ref 6–20)
CHLORIDE: 96 mmol/L — AB (ref 101–111)
CO2: 28 mmol/L (ref 22–32)
CREATININE: 0.74 mg/dL (ref 0.44–1.00)
Calcium: 9 mg/dL (ref 8.9–10.3)
Glucose, Bld: 106 mg/dL — ABNORMAL HIGH (ref 65–99)
POTASSIUM: 3.5 mmol/L (ref 3.5–5.1)
SODIUM: 132 mmol/L — AB (ref 135–145)
Total Bilirubin: 0.3 mg/dL (ref 0.3–1.2)
Total Protein: 6.1 g/dL — ABNORMAL LOW (ref 6.5–8.1)

## 2017-04-07 LAB — TYPE AND SCREEN
ABO/RH(D): A POS
ANTIBODY SCREEN: NEGATIVE

## 2017-04-07 LAB — CBC
HCT: 31.6 % — ABNORMAL LOW (ref 35.0–47.0)
Hemoglobin: 10.3 g/dL — ABNORMAL LOW (ref 12.0–16.0)
MCH: 29 pg (ref 26.0–34.0)
MCHC: 32.5 g/dL (ref 32.0–36.0)
MCV: 89.1 fL (ref 80.0–100.0)
PLATELETS: 251 10*3/uL (ref 150–440)
RBC: 3.54 MIL/uL — ABNORMAL LOW (ref 3.80–5.20)
RDW: 15.2 % — AB (ref 11.5–14.5)
WBC: 2.9 10*3/uL — ABNORMAL LOW (ref 3.6–11.0)

## 2017-04-07 LAB — C-REACTIVE PROTEIN: CRP: 0.9 mg/dL (ref <1.0)

## 2017-04-07 LAB — URINALYSIS, ROUTINE W REFLEX MICROSCOPIC
BACTERIA UA: NONE SEEN
BILIRUBIN URINE: NEGATIVE
Glucose, UA: NEGATIVE mg/dL
Hgb urine dipstick: NEGATIVE
Ketones, ur: NEGATIVE mg/dL
NITRITE: NEGATIVE
PROTEIN: NEGATIVE mg/dL
SPECIFIC GRAVITY, URINE: 1.005 (ref 1.005–1.030)
pH: 5 (ref 5.0–8.0)

## 2017-04-07 LAB — PROTIME-INR
INR: 0.94
PROTHROMBIN TIME: 12.5 s (ref 11.4–15.2)

## 2017-04-07 LAB — SURGICAL PCR SCREEN
MRSA, PCR: NEGATIVE
STAPHYLOCOCCUS AUREUS: NEGATIVE

## 2017-04-07 LAB — SEDIMENTATION RATE: SED RATE: 16 mm/h (ref 0–30)

## 2017-04-07 LAB — APTT: aPTT: 27 seconds (ref 24–36)

## 2017-04-07 NOTE — Patient Instructions (Signed)
Your procedure is scheduled on: Friday, April 16, 2017 Report to Same Day Surgery on the 2nd floor in the [facility]. To find out your arrival time, please call [phone] between 1PM - 3PM on: Wednesday, April 14, 2017  REMEMBER: Instructions that are not followed completely may result in serious medical risk, up to and including death; or upon the discretion of your surgeon and anesthesiologist your surgery may need to be rescheduled.  Do not eat food after midnight the night before your procedure.  No gum chewing or hard candies.  You may however, drink CLEAR liquids up to 2 hours before you are scheduled to arrive at the hospital for your procedure.  Do not drink clear liquids within 2 hours of the start of your surgery.  Clear liquids include: - water  - apple juice without pulp - clear gatorade - black coffee or tea (Do NOT add anything to the coffee or tea) Do NOT drink anything that is not on this list.  No Alcohol for 24 hours before or after surgery.  No Smoking including e-cigarettes for 24 hours prior to surgery. No chewable tobacco products for at least 6 hours prior to surgery. No nicotine patches on the day of surgery.  Notify your doctor if there is any change in your medical condition (cold, fever, infection).  Do not wear jewelry, make-up, hairpins, clips or nail polish.  Do not wear lotions, powders, or perfumes. You may wear deodorant.  Do not shave 48 hours prior to surgery.   Contacts and dentures may not be worn into surgery.  Do not bring valuables to the hospital. [facility] is not responsible for any belongings or valuables.   TAKE THESE MEDICATIONS THE MORNING OF SURGERY WITH A SIP OF WATER:  1.  LEVOTHYROXINE 2.  METOPROLOL 3.  PANTROPRAZOLE  Use CHG Soap as directed on instruction sheet.  Follow recommendations from Cardiologist, Pulmonologist or PCP regarding stopping Aspirin.  (STOP NOW)  NOW!  Stop Anti-inflammatories such as  Advil, Aleve, Ibuprofen, Motrin, Naproxen, Naprosyn, Goodie powder, or aspirin products. (May take Tylenol or Acetaminophen if needed.)  NOW!  Stop OVER THE COUNTER supplements until after surgery.  If you are being admitted to the hospital overnight, leave your suitcase in the car. After surgery it may be brought to your room.  Please call the number above if you have any questions about these instructions.

## 2017-04-08 LAB — IGE: IgE (Immunoglobulin E), Serum: 7 IU/mL (ref 0–100)

## 2017-04-08 NOTE — Pre-Procedure Instructions (Signed)
ua faxed to dr hooten 

## 2017-04-09 LAB — URINE CULTURE: Special Requests: NORMAL

## 2017-04-13 MED ORDER — CLINDAMYCIN PHOSPHATE 900 MG/50ML IV SOLN
900.0000 mg | INTRAVENOUS | Status: AC
  Administered 2017-04-14: 900 mg via INTRAVENOUS

## 2017-04-13 MED ORDER — TRANEXAMIC ACID 1000 MG/10ML IV SOLN
1000.0000 mg | INTRAVENOUS | Status: DC
  Filled 2017-04-13: qty 10

## 2017-04-14 ENCOUNTER — Other Ambulatory Visit: Payer: Self-pay

## 2017-04-14 ENCOUNTER — Inpatient Hospital Stay: Payer: PPO | Admitting: Anesthesiology

## 2017-04-14 ENCOUNTER — Encounter
Admission: RE | Disposition: A | Discharge status: Skilled Nursing Facility | Payer: Self-pay | Source: Ambulatory Visit | Attending: Orthopedic Surgery

## 2017-04-14 ENCOUNTER — Encounter: Payer: Self-pay | Admitting: Orthopedic Surgery

## 2017-04-14 ENCOUNTER — Inpatient Hospital Stay
Admission: RE | Admit: 2017-04-14 | Discharge: 2017-04-18 | DRG: 470 | Disposition: A | Discharge status: Skilled Nursing Facility | Payer: PPO | Source: Ambulatory Visit | Attending: Orthopedic Surgery | Admitting: Orthopedic Surgery

## 2017-04-14 ENCOUNTER — Inpatient Hospital Stay: Payer: PPO

## 2017-04-14 DIAGNOSIS — G4730 Sleep apnea, unspecified: Secondary | ICD-10-CM | POA: Diagnosis not present

## 2017-04-14 DIAGNOSIS — Z471 Aftercare following joint replacement surgery: Secondary | ICD-10-CM | POA: Diagnosis not present

## 2017-04-14 DIAGNOSIS — E871 Hypo-osmolality and hyponatremia: Secondary | ICD-10-CM | POA: Diagnosis not present

## 2017-04-14 DIAGNOSIS — Z882 Allergy status to sulfonamides status: Secondary | ICD-10-CM | POA: Diagnosis not present

## 2017-04-14 DIAGNOSIS — K5791 Diverticulosis of intestine, part unspecified, without perforation or abscess with bleeding: Secondary | ICD-10-CM | POA: Diagnosis not present

## 2017-04-14 DIAGNOSIS — Z96652 Presence of left artificial knee joint: Secondary | ICD-10-CM | POA: Diagnosis not present

## 2017-04-14 DIAGNOSIS — Z9989 Dependence on other enabling machines and devices: Secondary | ICD-10-CM

## 2017-04-14 DIAGNOSIS — Z8679 Personal history of other diseases of the circulatory system: Secondary | ICD-10-CM | POA: Diagnosis not present

## 2017-04-14 DIAGNOSIS — I10 Essential (primary) hypertension: Secondary | ICD-10-CM | POA: Diagnosis not present

## 2017-04-14 DIAGNOSIS — Z79899 Other long term (current) drug therapy: Secondary | ICD-10-CM

## 2017-04-14 DIAGNOSIS — M1712 Unilateral primary osteoarthritis, left knee: Principal | ICD-10-CM | POA: Diagnosis present

## 2017-04-14 DIAGNOSIS — I739 Peripheral vascular disease, unspecified: Secondary | ICD-10-CM | POA: Diagnosis not present

## 2017-04-14 DIAGNOSIS — Z6832 Body mass index (BMI) 32.0-32.9, adult: Secondary | ICD-10-CM

## 2017-04-14 DIAGNOSIS — G2581 Restless legs syndrome: Secondary | ICD-10-CM | POA: Diagnosis not present

## 2017-04-14 DIAGNOSIS — K219 Gastro-esophageal reflux disease without esophagitis: Secondary | ICD-10-CM | POA: Diagnosis not present

## 2017-04-14 DIAGNOSIS — E669 Obesity, unspecified: Secondary | ICD-10-CM | POA: Diagnosis not present

## 2017-04-14 DIAGNOSIS — Z96659 Presence of unspecified artificial knee joint: Secondary | ICD-10-CM

## 2017-04-14 DIAGNOSIS — M25569 Pain in unspecified knee: Secondary | ICD-10-CM | POA: Diagnosis not present

## 2017-04-14 DIAGNOSIS — E039 Hypothyroidism, unspecified: Secondary | ICD-10-CM | POA: Diagnosis not present

## 2017-04-14 DIAGNOSIS — G4733 Obstructive sleep apnea (adult) (pediatric): Secondary | ICD-10-CM | POA: Diagnosis present

## 2017-04-14 DIAGNOSIS — I352 Nonrheumatic aortic (valve) stenosis with insufficiency: Secondary | ICD-10-CM | POA: Diagnosis not present

## 2017-04-14 DIAGNOSIS — K900 Celiac disease: Secondary | ICD-10-CM | POA: Diagnosis not present

## 2017-04-14 DIAGNOSIS — R2689 Other abnormalities of gait and mobility: Secondary | ICD-10-CM | POA: Diagnosis not present

## 2017-04-14 DIAGNOSIS — Z880 Allergy status to penicillin: Secondary | ICD-10-CM

## 2017-04-14 DIAGNOSIS — E785 Hyperlipidemia, unspecified: Secondary | ICD-10-CM | POA: Diagnosis not present

## 2017-04-14 DIAGNOSIS — M6281 Muscle weakness (generalized): Secondary | ICD-10-CM | POA: Diagnosis not present

## 2017-04-14 HISTORY — PX: KNEE ARTHROPLASTY: SHX992

## 2017-04-14 LAB — ABO/RH: ABO/RH(D): A POS

## 2017-04-14 SURGERY — ARTHROPLASTY, KNEE, TOTAL, USING IMAGELESS COMPUTER-ASSISTED NAVIGATION
Anesthesia: Spinal | Site: Knee | Laterality: Left | Wound class: Clean

## 2017-04-14 MED ORDER — POTASSIUM CHLORIDE ER 10 MEQ PO TBCR
30.0000 meq | EXTENDED_RELEASE_TABLET | Freq: Every day | ORAL | Status: DC
  Administered 2017-04-16 – 2017-04-18 (×3): 30 meq via ORAL
  Filled 2017-04-14 (×8): qty 3

## 2017-04-14 MED ORDER — ACETAMINOPHEN 10 MG/ML IV SOLN
INTRAVENOUS | Status: AC
  Filled 2017-04-14: qty 100

## 2017-04-14 MED ORDER — ENOXAPARIN SODIUM 30 MG/0.3ML ~~LOC~~ SOLN
30.0000 mg | Freq: Two times a day (BID) | SUBCUTANEOUS | Status: DC
  Administered 2017-04-15 – 2017-04-18 (×7): 30 mg via SUBCUTANEOUS
  Filled 2017-04-14 (×7): qty 0.3

## 2017-04-14 MED ORDER — TRANEXAMIC ACID 1000 MG/10ML IV SOLN
1000.0000 mg | Freq: Once | INTRAVENOUS | Status: AC
  Administered 2017-04-14: 1000 mg via INTRAVENOUS
  Filled 2017-04-14: qty 10

## 2017-04-14 MED ORDER — BUPIVACAINE HCL (PF) 0.5 % IJ SOLN
INTRAMUSCULAR | Status: DC | PRN
  Administered 2017-04-14: 2.5 mL

## 2017-04-14 MED ORDER — LORATADINE 10 MG PO TABS: 10.0000 mg | ORAL_TABLET | Freq: Every day | ORAL | Status: DC | PRN

## 2017-04-14 MED ORDER — CELECOXIB 200 MG PO CAPS
200.0000 mg | ORAL_CAPSULE | Freq: Two times a day (BID) | ORAL | Status: DC
  Administered 2017-04-14 – 2017-04-18 (×8): 200 mg via ORAL
  Filled 2017-04-14 (×8): qty 1

## 2017-04-14 MED ORDER — MEPERIDINE HCL 50 MG/ML IJ SOLN: 6.2500 mg | INTRAMUSCULAR | Status: DC | PRN

## 2017-04-14 MED ORDER — FENTANYL CITRATE (PF) 100 MCG/2ML IJ SOLN
INTRAMUSCULAR | Status: DC | PRN
  Administered 2017-04-14: 50 ug via INTRAVENOUS
  Administered 2017-04-14 (×2): 25 ug via INTRAVENOUS

## 2017-04-14 MED ORDER — LEVOTHYROXINE SODIUM 100 MCG PO TABS
125.0000 ug | ORAL_TABLET | Freq: Every day | ORAL | Status: DC
  Administered 2017-04-15 – 2017-04-18 (×4): 125 ug via ORAL
  Filled 2017-04-14 (×4): qty 1

## 2017-04-14 MED ORDER — ROPINIROLE HCL 1 MG PO TABS
1.0000 mg | ORAL_TABLET | Freq: Every day | ORAL | Status: DC
  Administered 2017-04-14 – 2017-04-17 (×4): 1 mg via ORAL
  Filled 2017-04-14 (×4): qty 1

## 2017-04-14 MED ORDER — OXYCODONE HCL 5 MG PO TABS: 5.0000 mg | ORAL_TABLET | Freq: Once | ORAL | Status: DC | PRN

## 2017-04-14 MED ORDER — MORPHINE SULFATE (PF) 2 MG/ML IV SOLN: 2.0000 mg | INTRAVENOUS | Status: DC | PRN

## 2017-04-14 MED ORDER — NEOMYCIN-POLYMYXIN B GU 40-200000 IR SOLN
Status: AC
  Filled 2017-04-14: qty 20

## 2017-04-14 MED ORDER — HYDRALAZINE HCL 20 MG/ML IJ SOLN
10.0000 mg | Freq: Once | INTRAMUSCULAR | Status: AC
  Administered 2017-04-14: 10 mg via INTRAVENOUS
  Filled 2017-04-14: qty 1

## 2017-04-14 MED ORDER — MENTHOL 3 MG MT LOZG
1.0000 | LOZENGE | OROMUCOSAL | Status: DC | PRN
  Filled 2017-04-14: qty 9

## 2017-04-14 MED ORDER — FLEET ENEMA 7-19 GM/118ML RE ENEM: 1.0000 | ENEMA | Freq: Once | RECTAL | Status: DC | PRN

## 2017-04-14 MED ORDER — SODIUM CHLORIDE 0.9 % IV SOLN
INTRAVENOUS | Status: DC | PRN
  Administered 2017-04-14: 60 mL

## 2017-04-14 MED ORDER — METOCLOPRAMIDE HCL 10 MG PO TABS
10.0000 mg | ORAL_TABLET | Freq: Three times a day (TID) | ORAL | Status: AC
  Administered 2017-04-14 – 2017-04-16 (×8): 10 mg via ORAL
  Filled 2017-04-14 (×8): qty 1

## 2017-04-14 MED ORDER — BUPIVACAINE HCL (PF) 0.25 % IJ SOLN
INTRAMUSCULAR | Status: AC
  Filled 2017-04-14: qty 60

## 2017-04-14 MED ORDER — PROPOFOL 10 MG/ML IV BOLUS
INTRAVENOUS | Status: DC | PRN
  Administered 2017-04-14: 20 mg via INTRAVENOUS

## 2017-04-14 MED ORDER — ONDANSETRON HCL 4 MG PO TABS: 4.0000 mg | ORAL_TABLET | Freq: Four times a day (QID) | ORAL | Status: DC | PRN

## 2017-04-14 MED ORDER — PROPOFOL 500 MG/50ML IV EMUL
INTRAVENOUS | Status: AC
  Filled 2017-04-14: qty 50

## 2017-04-14 MED ORDER — PHENYLEPHRINE HCL 10 MG/ML IJ SOLN
INTRAMUSCULAR | Status: DC | PRN
  Administered 2017-04-14: 25 ug/min via INTRAVENOUS

## 2017-04-14 MED ORDER — METOPROLOL SUCCINATE ER 25 MG PO TB24
25.0000 mg | ORAL_TABLET | Freq: Every day | ORAL | Status: DC
  Administered 2017-04-15 – 2017-04-18 (×4): 25 mg via ORAL
  Filled 2017-04-14 (×4): qty 1

## 2017-04-14 MED ORDER — OXYCODONE HCL 5 MG PO TABS: 10.0000 mg | ORAL_TABLET | ORAL | Status: DC | PRN

## 2017-04-14 MED ORDER — DIPHENHYDRAMINE HCL 12.5 MG/5ML PO ELIX
12.5000 mg | ORAL_SOLUTION | ORAL | Status: DC | PRN
Start: 2017-04-14 — End: 2017-04-18

## 2017-04-14 MED ORDER — ALUM & MAG HYDROXIDE-SIMETH 200-200-20 MG/5ML PO SUSP: 30.0000 mL | ORAL | Status: DC | PRN

## 2017-04-14 MED ORDER — NEOMYCIN-POLYMYXIN B GU 40-200000 IR SOLN
Status: DC | PRN
  Administered 2017-04-14: 14 mL

## 2017-04-14 MED ORDER — FENTANYL CITRATE (PF) 100 MCG/2ML IJ SOLN: 25.0000 ug | INTRAMUSCULAR | Status: DC | PRN

## 2017-04-14 MED ORDER — FLUTICASONE PROPIONATE 50 MCG/ACT NA SUSP
1.0000 | Freq: Every day | NASAL | Status: DC
  Administered 2017-04-15 – 2017-04-18 (×4): 1 via NASAL
  Filled 2017-04-14: qty 16

## 2017-04-14 MED ORDER — FERROUS SULFATE 325 (65 FE) MG PO TABS
325.0000 mg | ORAL_TABLET | Freq: Two times a day (BID) | ORAL | Status: DC
  Administered 2017-04-15 – 2017-04-17 (×6): 325 mg via ORAL
  Filled 2017-04-14 (×7): qty 1

## 2017-04-14 MED ORDER — PROPOFOL 10 MG/ML IV BOLUS
INTRAVENOUS | Status: AC
  Filled 2017-04-14: qty 20

## 2017-04-14 MED ORDER — BUPIVACAINE LIPOSOME 1.3 % IJ SUSP
INTRAMUSCULAR | Status: AC
  Filled 2017-04-14: qty 20

## 2017-04-14 MED ORDER — SENNOSIDES-DOCUSATE SODIUM 8.6-50 MG PO TABS
1.0000 | ORAL_TABLET | Freq: Two times a day (BID) | ORAL | Status: DC
  Administered 2017-04-14 – 2017-04-18 (×7): 1 via ORAL
  Filled 2017-04-14 (×8): qty 1

## 2017-04-14 MED ORDER — CLINDAMYCIN PHOSPHATE 900 MG/50ML IV SOLN
INTRAVENOUS | Status: AC
  Filled 2017-04-14: qty 50

## 2017-04-14 MED ORDER — ACETAMINOPHEN 325 MG PO TABS: 650.0000 mg | ORAL_TABLET | ORAL | Status: DC | PRN

## 2017-04-14 MED ORDER — PANTOPRAZOLE SODIUM 40 MG PO TBEC
40.0000 mg | DELAYED_RELEASE_TABLET | Freq: Two times a day (BID) | ORAL | Status: DC
  Administered 2017-04-14 – 2017-04-18 (×8): 40 mg via ORAL
  Filled 2017-04-14 (×8): qty 1

## 2017-04-14 MED ORDER — SODIUM CHLORIDE 0.9 % IV SOLN
INTRAVENOUS | Status: DC
  Administered 2017-04-14 – 2017-04-15 (×2): via INTRAVENOUS

## 2017-04-14 MED ORDER — CLINDAMYCIN PHOSPHATE 600 MG/50ML IV SOLN
600.0000 mg | Freq: Four times a day (QID) | INTRAVENOUS | Status: AC
  Administered 2017-04-14 – 2017-04-15 (×4): 600 mg via INTRAVENOUS
  Filled 2017-04-14 (×4): qty 50

## 2017-04-14 MED ORDER — SODIUM CHLORIDE 0.9 % IJ SOLN
INTRAMUSCULAR | Status: AC
  Filled 2017-04-14: qty 50

## 2017-04-14 MED ORDER — LEVOCETIRIZINE DIHYDROCHLORIDE 5 MG PO TABS: 5.0000 mg | ORAL_TABLET | Freq: Every day | ORAL | Status: DC | PRN

## 2017-04-14 MED ORDER — MAGNESIUM HYDROXIDE 400 MG/5ML PO SUSP
30.0000 mL | Freq: Every day | ORAL | Status: DC | PRN
  Administered 2017-04-16: 30 mL via ORAL
  Filled 2017-04-14: qty 30

## 2017-04-14 MED ORDER — ACETAMINOPHEN 10 MG/ML IV SOLN
1000.0000 mg | Freq: Four times a day (QID) | INTRAVENOUS | Status: AC
  Administered 2017-04-14 – 2017-04-15 (×3): 1000 mg via INTRAVENOUS
  Filled 2017-04-14 (×4): qty 100

## 2017-04-14 MED ORDER — CHLORHEXIDINE GLUCONATE 4 % EX LIQD: 60.0000 mL | Freq: Once | CUTANEOUS | Status: DC

## 2017-04-14 MED ORDER — TETRACAINE HCL 1 % IJ SOLN
INTRAMUSCULAR | Status: DC | PRN
  Administered 2017-04-14: 5 mg via INTRASPINAL

## 2017-04-14 MED ORDER — PHENOL 1.4 % MT LIQD
1.0000 | OROMUCOSAL | Status: DC | PRN
  Filled 2017-04-14: qty 177

## 2017-04-14 MED ORDER — FENTANYL CITRATE (PF) 100 MCG/2ML IJ SOLN
INTRAMUSCULAR | Status: AC
  Filled 2017-04-14: qty 2

## 2017-04-14 MED ORDER — FUROSEMIDE 20 MG PO TABS
20.0000 mg | ORAL_TABLET | Freq: Every day | ORAL | Status: DC
  Administered 2017-04-16: 20 mg via ORAL
  Filled 2017-04-14 (×2): qty 1

## 2017-04-14 MED ORDER — OXYCODONE HCL 5 MG/5ML PO SOLN: 5.0000 mg | Freq: Once | ORAL | Status: DC | PRN

## 2017-04-14 MED ORDER — LACTATED RINGERS IV SOLN
INTRAVENOUS | Status: DC
  Administered 2017-04-14 (×2): via INTRAVENOUS

## 2017-04-14 MED ORDER — TRANEXAMIC ACID 1000 MG/10ML IV SOLN
INTRAVENOUS | Status: DC | PRN
  Administered 2017-04-14: 1000 mg via INTRAVENOUS

## 2017-04-14 MED ORDER — MONTELUKAST SODIUM 10 MG PO TABS
10.0000 mg | ORAL_TABLET | ORAL | Status: DC
  Administered 2017-04-15 – 2017-04-18 (×4): 10 mg via ORAL
  Filled 2017-04-14 (×4): qty 1

## 2017-04-14 MED ORDER — TRAMADOL HCL 50 MG PO TABS
50.0000 mg | ORAL_TABLET | ORAL | Status: DC | PRN
  Administered 2017-04-14 – 2017-04-18 (×12): 50 mg via ORAL
  Filled 2017-04-14 (×8): qty 1
  Filled 2017-04-14: qty 2
  Filled 2017-04-14 (×4): qty 1

## 2017-04-14 MED ORDER — ACETAMINOPHEN 650 MG RE SUPP: 650.0000 mg | RECTAL | Status: DC | PRN

## 2017-04-14 MED ORDER — OXYCODONE HCL 5 MG PO TABS
5.0000 mg | ORAL_TABLET | ORAL | Status: DC | PRN
  Administered 2017-04-14 – 2017-04-16 (×6): 5 mg via ORAL
  Filled 2017-04-14 (×6): qty 1

## 2017-04-14 MED ORDER — BUPIVACAINE HCL (PF) 0.25 % IJ SOLN
INTRAMUSCULAR | Status: DC | PRN
  Administered 2017-04-14: 60 mL

## 2017-04-14 MED ORDER — ONDANSETRON HCL 4 MG/2ML IJ SOLN
4.0000 mg | Freq: Four times a day (QID) | INTRAMUSCULAR | Status: DC | PRN
  Administered 2017-04-15 – 2017-04-18 (×4): 4 mg via INTRAVENOUS
  Filled 2017-04-14 (×4): qty 2

## 2017-04-14 MED ORDER — PROPOFOL 500 MG/50ML IV EMUL
INTRAVENOUS | Status: DC | PRN
  Administered 2017-04-14: 75 ug/kg/min via INTRAVENOUS

## 2017-04-14 MED ORDER — PROMETHAZINE HCL 25 MG/ML IJ SOLN: 6.2500 mg | INTRAMUSCULAR | Status: DC | PRN

## 2017-04-14 MED ORDER — ROPINIROLE HCL 1 MG PO TABS
0.5000 mg | ORAL_TABLET | Freq: Every day | ORAL | Status: DC
  Administered 2017-04-15 – 2017-04-18 (×4): 0.5 mg via ORAL
  Filled 2017-04-14 (×4): qty 1

## 2017-04-14 MED ORDER — ACETAMINOPHEN 10 MG/ML IV SOLN
INTRAVENOUS | Status: DC | PRN
  Administered 2017-04-14: 1000 mg via INTRAVENOUS

## 2017-04-14 MED ORDER — BISACODYL 10 MG RE SUPP
10.0000 mg | Freq: Every day | RECTAL | Status: DC | PRN
  Administered 2017-04-16: 10 mg via RECTAL
  Filled 2017-04-14: qty 1

## 2017-04-14 SURGICAL SUPPLY — 71 items
BATTERY INSTRU NAVIGATION (MISCELLANEOUS) ×12 IMPLANT
BLADE SAW 1 (BLADE) ×3 IMPLANT
BLADE SAW 1/2 (BLADE) ×3 IMPLANT
BLADE SAW 70X12.5 (BLADE) IMPLANT
BTRY SRG DRVR LF (MISCELLANEOUS) ×4
CANISTER SUCT 1200ML W/VALVE (MISCELLANEOUS) ×3 IMPLANT
CANISTER SUCT 3000ML PPV (MISCELLANEOUS) ×6 IMPLANT
CAPT KNEE TOTAL 3 ATTUNE ×2 IMPLANT
CEMENT HV SMART SET (Cement) ×6 IMPLANT
COOLER POLAR GLACIER W/PUMP (MISCELLANEOUS) ×3 IMPLANT
CUFF TOURN 24 STER (MISCELLANEOUS) IMPLANT
CUFF TOURN 30 STER DUAL PORT (MISCELLANEOUS) ×2 IMPLANT
DRAPE SHEET LG 3/4 BI-LAMINATE (DRAPES) ×3 IMPLANT
DRSG DERMACEA 8X12 NADH (GAUZE/BANDAGES/DRESSINGS) ×3 IMPLANT
DRSG OPSITE POSTOP 4X14 (GAUZE/BANDAGES/DRESSINGS) ×3 IMPLANT
DRSG TEGADERM 4X4.75 (GAUZE/BANDAGES/DRESSINGS) ×3 IMPLANT
DURAPREP 26ML APPLICATOR (WOUND CARE) ×6 IMPLANT
ELECT CAUTERY BLADE 6.4 (BLADE) ×3 IMPLANT
ELECT REM PT RETURN 9FT ADLT (ELECTROSURGICAL) ×3
ELECTRODE REM PT RTRN 9FT ADLT (ELECTROSURGICAL) ×1 IMPLANT
EVACUATOR 1/8 PVC DRAIN (DRAIN) ×3 IMPLANT
EX-PIN ORTHOLOCK NAV 4X150 (PIN) ×6 IMPLANT
GLOVE BIO SURGEON STRL SZ 6.5 (GLOVE) ×4 IMPLANT
GLOVE BIO SURGEONS STRL SZ 6.5 (GLOVE) ×4
GLOVE BIOGEL M STRL SZ7.5 (GLOVE) ×6 IMPLANT
GLOVE BIOGEL PI IND STRL 7.5 (GLOVE) IMPLANT
GLOVE BIOGEL PI IND STRL 9 (GLOVE) ×1 IMPLANT
GLOVE BIOGEL PI INDICATOR 7.5 (GLOVE) ×8
GLOVE BIOGEL PI INDICATOR 9 (GLOVE) ×2
GLOVE INDICATOR 8.0 STRL GRN (GLOVE) ×3 IMPLANT
GLOVE SURG SYN 9.0  PF PI (GLOVE) ×2
GLOVE SURG SYN 9.0 PF PI (GLOVE) ×1 IMPLANT
GOWN STRL REUS W/ TWL LRG LVL3 (GOWN DISPOSABLE) ×2 IMPLANT
GOWN STRL REUS W/TWL 2XL LVL3 (GOWN DISPOSABLE) ×5 IMPLANT
GOWN STRL REUS W/TWL LRG LVL3 (GOWN DISPOSABLE) ×6
HOLDER FOLEY CATH W/STRAP (MISCELLANEOUS) ×3 IMPLANT
HOOD PEEL AWAY FLYTE STAYCOOL (MISCELLANEOUS) ×6 IMPLANT
KIT RM TURNOVER STRD PROC AR (KITS) ×3 IMPLANT
KNIFE SCULPS 14X20 (INSTRUMENTS) ×3 IMPLANT
LABEL OR SOLS (LABEL) ×3 IMPLANT
NDL SAFETY ECLIPSE 18X1.5 (NEEDLE) ×1 IMPLANT
NDL SPNL 20GX3.5 QUINCKE YW (NEEDLE) ×2 IMPLANT
NEEDLE HYPO 18GX1.5 SHARP (NEEDLE) ×3
NEEDLE SPNL 20GX3.5 QUINCKE YW (NEEDLE) ×6 IMPLANT
NS IRRIG 500ML POUR BTL (IV SOLUTION) ×3 IMPLANT
PACK TOTAL KNEE (MISCELLANEOUS) ×3 IMPLANT
PAD WRAPON POLAR KNEE (MISCELLANEOUS) ×1 IMPLANT
PIN DRILL QUICK PACK ×3 IMPLANT
PIN FIXATION 1/8DIA X 3INL (PIN) ×3 IMPLANT
PULSAVAC PLUS IRRIG FAN TIP (DISPOSABLE) ×3
SOL .9 NS 3000ML IRR  AL (IV SOLUTION) ×2
SOL .9 NS 3000ML IRR AL (IV SOLUTION) ×1
SOL .9 NS 3000ML IRR UROMATIC (IV SOLUTION) ×1 IMPLANT
SOL PREP PVP 2OZ (MISCELLANEOUS) ×3
SOLUTION PREP PVP 2OZ (MISCELLANEOUS) ×1 IMPLANT
SPONGE DRAIN TRACH 4X4 STRL 2S (GAUZE/BANDAGES/DRESSINGS) ×3 IMPLANT
STAPLER SKIN PROX 35W (STAPLE) ×3 IMPLANT
STRAP TIBIA SHORT (MISCELLANEOUS) ×3 IMPLANT
SUCTION FRAZIER HANDLE 10FR (MISCELLANEOUS) ×2
SUCTION TUBE FRAZIER 10FR DISP (MISCELLANEOUS) ×1 IMPLANT
SUT VIC AB 0 CT1 36 (SUTURE) ×3 IMPLANT
SUT VIC AB 0 UR5 27 (SUTURE) IMPLANT
SUT VIC AB 1 CT1 36 (SUTURE) ×6 IMPLANT
SUT VIC AB 2-0 CT2 27 (SUTURE) ×3 IMPLANT
SYR 20CC LL (SYRINGE) ×3 IMPLANT
SYR 30ML LL (SYRINGE) ×6 IMPLANT
TIP FAN IRRIG PULSAVAC PLUS (DISPOSABLE) ×1 IMPLANT
TOWEL OR 17X26 4PK STRL BLUE (TOWEL DISPOSABLE) ×3 IMPLANT
TOWER CARTRIDGE SMART MIX (DISPOSABLE) ×3 IMPLANT
TRAY FOLEY W/METER SILVER 16FR (SET/KITS/TRAYS/PACK) ×3 IMPLANT
WRAPON POLAR PAD KNEE (MISCELLANEOUS) ×3

## 2017-04-14 NOTE — H&P (Signed)
The patient has been re-examined, and the chart reviewed, and there have been no interval changes to the documented history and physical.    The risks, benefits, and alternatives have been discussed at length. The patient expressed understanding of the risks benefits and agreed with plans for surgical intervention.  James P. Hooten, Jr. M.D.    

## 2017-04-14 NOTE — Transfer of Care (Signed)
Immediate Anesthesia Transfer of Care Note  Patient: Deborah Deleon  Procedure(s) Performed: COMPUTER ASSISTED TOTAL KNEE ARTHROPLASTY (Left Knee)  Patient Location: PACU  Anesthesia Type:Spinal  Level of Consciousness: awake, alert  and oriented  Airway & Oxygen Therapy: Patient Spontanous Breathing and Patient connected to face mask oxygen  Post-op Assessment: Report given to RN and Post -op Vital signs reviewed and stable  Post vital signs: Reviewed and stable  Last Vitals:  Vitals:   04/14/17 1019  BP: (!) 172/87  Pulse: 81  Resp: 18  Temp: 36.4 C  SpO2: 99%    Last Pain:  Vitals:   04/14/17 1019  TempSrc: Oral  PainSc: 3          Complications: No apparent anesthesia complications

## 2017-04-14 NOTE — Op Note (Signed)
OPERATIVE NOTE  DATE OF SURGERY:  04/14/2017  PATIENT NAME:  Deborah Deleon   DOB: November 15, 1934  MRN: [id number]  PRE-OPERATIVE DIAGNOSIS: Degenerative arthrosis of the left knee, primary  POST-OPERATIVE DIAGNOSIS:  Same  PROCEDURE:  Left total knee arthroplasty using computer-assisted navigation  SURGEON:  Marciano Sequin. M.D.  ASSISTANT:  Vance Peper, PA (present and scrubbed throughout the case, critical for assistance with exposure, retraction, instrumentation, and closure)  ANESTHESIA: spinal  ESTIMATED BLOOD LOSS: 50 mL  FLUIDS REPLACED: 1400 mL of crystalloid  TOURNIQUET TIME: 93 minutes  DRAINS: 2 medium Hemovac drains  SOFT TISSUE RELEASES: Anterior cruciate ligament, posterior cruciate ligament, deep medial collateral ligament, patellofemoral ligament  IMPLANTS UTILIZED: DePuy Attune size 7 posterior stabilized femoral component (cemented), size 6 rotating platform tibial component (cemented), 38 mm medialized dome patella (cemented), and a 5 mm stabilized rotating platform polyethylene insert.  INDICATIONS FOR SURGERY: Deborah Deleon is a 82 y.o. year old female with a long history of progressive knee pain. X-rays demonstrated severe degenerative changes in tricompartmental fashion. The patient had not seen any significant improvement despite conservative nonsurgical intervention. After discussion of the risks and benefits of surgical intervention, the patient expressed understanding of the risks benefits and agree with plans for total knee arthroplasty.   The risks, benefits, and alternatives were discussed at length including but not limited to the risks of infection, bleeding, nerve injury, stiffness, blood clots, the need for revision surgery, cardiopulmonary complications, among others, and they were willing to proceed.  PROCEDURE IN DETAIL: The patient was brought into the operating room and, after adequate spinal anesthesia was achieved, a tourniquet was placed  on the patient's upper thigh. The patient's knee and leg were cleaned and prepped with alcohol and DuraPrep and draped in the usual sterile fashion. A "timeout" was performed as per usual protocol. The lower extremity was exsanguinated using an Esmarch, and the tourniquet was inflated to 300 mmHg. An anterior longitudinal incision was made followed by a standard mid vastus approach. The deep fibers of the medial collateral ligament were elevated in a subperiosteal fashion off of the medial flare of the tibia so as to maintain a continuous soft tissue sleeve. The patella was subluxed laterally and the patellofemoral ligament was incised. Inspection of the knee demonstrated severe degenerative changes with full-thickness loss of articular cartilage. Osteophytes were debrided using a rongeur. Anterior and posterior cruciate ligaments were excised. Two 4.0 mm Schanz pins were inserted in the femur and into the tibia for attachment of the array of trackers used for computer-assisted navigation. Hip center was identified using a circumduction technique. Distal landmarks were mapped using the computer. The distal femur and proximal tibia were mapped using the computer. The distal femoral cutting guide was positioned using computer-assisted navigation so as to achieve a 5 distal valgus cut. The femur was sized and it was felt that a size 7 femoral component was appropriate. A size 7 femoral cutting guide was positioned and the anterior cut was performed and verified using the computer. This was followed by completion of the posterior and chamfer cuts. Femoral cutting guide for the central box was then positioned in the center box cut was performed.  Attention was then directed to the proximal tibia. Medial and lateral menisci were excised. The extramedullary tibial cutting guide was positioned using computer-assisted navigation so as to achieve a 0 varus-valgus alignment and 3 posterior slope. The cut was performed and  verified using the computer. The proximal tibia  was sized and it was felt that a size 6 tibial tray was appropriate. Tibial and femoral trials were inserted followed by insertion of a 5 mm polyethylene insert. This allowed for excellent mediolateral soft tissue balancing both in flexion and in full extension. Finally, the patella was cut and prepared so as to accommodate a 38 mm medialized dome patella. A patella trial was placed and the knee was placed through a range of motion with excellent patellar tracking appreciated. The femoral trial was removed after debridement of posterior osteophytes. The central post-hole for the tibial component was reamed followed by insertion of a keel punch. Tibial trials were then removed. Cut surfaces of bone were irrigated with copious amounts of normal saline with antibiotic solution using pulsatile lavage and then suctioned dry. Polymethylmethacrylate cement was prepared in the usual fashion using a vacuum mixer. Cement was applied to the cut surface of the proximal tibia as well as along the undersurface of a size 6 rotating platform tibial component. Tibial component was positioned and impacted into place. Excess cement was removed using [employer] fast streamer. Cement was then applied to the cut surfaces of the femur as well as along the posterior flanges of the size 7 femoral component. The femoral component was positioned and impacted into place. Excess cement was removed using [employer] fast streamer. A 5 mm polyethylene trial was inserted and the knee was brought into full extension with steady axial compression applied. Finally, cement was applied to the backside of a 38 mm medialized dome patella and the patellar component was positioned and patellar clamp applied. Excess cement was removed using [employer] fast streamer. After adequate curing of the cement, the tourniquet was deflated after a total tourniquet time of 93 minutes. Hemostasis was achieved using electrocautery. The knee was  irrigated with copious amounts of normal saline with antibiotic solution using pulsatile lavage and then suctioned dry. 20 mL of 1.3% Exparel and 60 mL of 0.25% Marcaine in 40 mL of normal saline was injected along the posterior capsule, medial and lateral gutters, and along the arthrotomy site. A 5 mm stabilized rotating platform polyethylene insert was inserted and the knee was placed through a range of motion with excellent mediolateral soft tissue balancing appreciated and excellent patellar tracking noted. 2 medium drains were placed in the wound bed and brought out through separate stab incisions. The medial parapatellar portion of the incision was reapproximated using interrupted sutures of #1 Vicryl. Subcutaneous tissue was approximated in layers using first #0 Vicryl followed #2-0 Vicryl. The skin was approximated with skin staples. A sterile dressing was applied.  The patient tolerated the procedure well and was transported to the recovery room in stable condition.    James P. Holley Bouche., M.D.

## 2017-04-14 NOTE — Anesthesia Post-op Follow-up Note (Signed)
Anesthesia QCDR form completed.        

## 2017-04-14 NOTE — Anesthesia Postprocedure Evaluation (Signed)
Anesthesia Post Note  Patient: Deborah Deleon  Procedure(s) Performed: COMPUTER ASSISTED TOTAL KNEE ARTHROPLASTY (Left Knee)  Patient location during evaluation: PACU Anesthesia Type: Spinal Level of consciousness: awake and alert Pain management: pain level controlled Vital Signs Assessment: post-procedure vital signs reviewed and stable Respiratory status: spontaneous breathing, nonlabored ventilation, respiratory function stable and patient connected to nasal cannula oxygen Cardiovascular status: blood pressure returned to baseline and stable Postop Assessment: no apparent nausea or vomiting Anesthetic complications: no     Last Vitals:  Vitals:   04/14/17 1638 04/14/17 1653  BP: (!) 122/58 140/68  Pulse: 66 65  Resp: 15 16  Temp:  (!) 36.2 C  SpO2: 96% 98%    Last Pain:  Vitals:   04/14/17 1608  TempSrc:   PainSc: 0-No pain    LLE Motor Response: Purposeful movement (04/14/17 1713) LLE Sensation: No sensation (absent) (04/14/17 1713) RLE Motor Response: Purposeful movement (04/14/17 1713) RLE Sensation: Tingling (04/14/17 1713)      Molli Barrows

## 2017-04-14 NOTE — Progress Notes (Signed)
Chaplain responded to an OR for pt who wanted to complete Advanced Directive. CH met with pt and family at bedside. Pt asked [place] to educate her about the AD tomorrow. [place] to follow up with pt as requested.    04/14/17 1900  Clinical Encounter Type  Visited With Patient;Patient and family together  Visit Type Initial;Other (Comment)  Referral From Nurse  Consult/Referral To Chaplain  Spiritual Encounters  Spiritual Needs Literature

## 2017-04-14 NOTE — Anesthesia Procedure Notes (Signed)
Procedure Name: MAC Performed by: Demetrius Charity, CRNA Pre-anesthesia Checklist: Patient identified, Emergency Drugs available, Suction available, Patient being monitored and Timeout performed Oxygen Delivery Method: Simple face mask

## 2017-04-14 NOTE — Progress Notes (Signed)
Pt's BP 180/68, HR 72. Pain rated 3/10. Dr. Marry Guan paged and made aware. This nurse received orders for hydralazine 10 mg IV one time dose.

## 2017-04-14 NOTE — Anesthesia Procedure Notes (Signed)
Spinal  Patient location during procedure: OR Start time: 04/14/2017 12:11 PM Staffing Anesthesiologist: Emmie Niemann, MD Resident/CRNA: Demetrius Charity, CRNA Performed: resident/CRNA and anesthesiologist  Preanesthetic Checklist Completed: patient identified, site marked, surgical consent, pre-op evaluation, timeout performed, IV checked, risks and benefits discussed and monitors and equipment checked Spinal Block Patient position: sitting Prep: ChloraPrep Patient monitoring: heart rate, continuous pulse ox, blood pressure and cardiac monitor Approach: midline Location: L4-5 Injection technique: single-shot Needle Needle type: Introducer and Pencil-Tip  Needle gauge: 24 G Needle length: 12.7 cm Additional Notes Negative paresthesia. Negative blood return. Positive free-flowing CSF. Expiration date of kit checked and confirmed. Patient tolerated procedure well, without complications.

## 2017-04-14 NOTE — Anesthesia Preprocedure Evaluation (Signed)
Anesthesia Evaluation  Patient identified by MRN, date of birth, ID band Patient awake    Reviewed: Allergy & Precautions, NPO status , Patient's Chart, lab work & pertinent test results  History of Anesthesia Complications Negative for: history of anesthetic complications  Airway Mallampati: III  TM Distance: >3 FB Neck ROM: Full    Dental  (+) Poor Dentition   Pulmonary sleep apnea , neg COPD,    breath sounds clear to auscultation- rhonchi (-) wheezing      Cardiovascular hypertension, Pt. on medications (-) CAD, (-) Past MI and (-) Cardiac Stents  Rhythm:Regular Rate:Normal - Systolic murmurs and - Diastolic murmurs    Neuro/Psych negative neurological ROS  negative psych ROS   GI/Hepatic Neg liver ROS, GERD  ,  Endo/Other  neg diabetesHypothyroidism   Renal/GU negative Renal ROS     Musculoskeletal  (+) Arthritis ,   Abdominal (+) + obese,   Peds  Hematology negative hematology ROS (+)   Anesthesia Other Findings Past Medical History: No date: Allergy No date: Cataract cortical, senile No date: Celiac disease No date: Chicken pox No date: Collagenous colitis No date: Degenerative joint disease No date: Diverticulosis No date: Family history of adverse reaction to anesthesia     Comment:  daughter climbs the walls with anesthesia No date: GERD (gastroesophageal reflux disease) No date: Hypertension No date: Hypothyroidism No date: Restless leg   Reproductive/Obstetrics                             Lab Results  Component Value Date   WBC 2.9 (L) 04/07/2017   HGB 10.3 (L) 04/07/2017   HCT 31.6 (L) 04/07/2017   MCV 89.1 04/07/2017   PLT 251 04/07/2017    Anesthesia Physical Anesthesia Plan  ASA: III  Anesthesia Plan: Spinal   Post-op Pain Management:    Induction:   PONV Risk Score and Plan: 2 and Propofol infusion  Airway Management Planned: Natural  Airway  Additional Equipment:   Intra-op Plan:   Post-operative Plan:   Informed Consent: I have reviewed the patients History and Physical, chart, labs and discussed the procedure including the risks, benefits and alternatives for the proposed anesthesia with the patient or authorized representative who has indicated his/her understanding and acceptance.   Dental advisory given  Plan Discussed with: CRNA and Anesthesiologist  Anesthesia Plan Comments:         Anesthesia Quick Evaluation

## 2017-04-15 LAB — BASIC METABOLIC PANEL
Anion gap: 7 (ref 5–15)
BUN: 9 mg/dL (ref 6–20)
CALCIUM: 8.4 mg/dL — AB (ref 8.9–10.3)
CHLORIDE: 97 mmol/L — AB (ref 101–111)
CO2: 25 mmol/L (ref 22–32)
CREATININE: 0.47 mg/dL (ref 0.44–1.00)
Glucose, Bld: 96 mg/dL (ref 65–99)
Potassium: 4.1 mmol/L (ref 3.5–5.1)
SODIUM: 129 mmol/L — AB (ref 135–145)

## 2017-04-15 MED ORDER — ZOLPIDEM TARTRATE 5 MG PO TABS: 5.0000 mg | ORAL_TABLET | Freq: Every evening | ORAL | Status: DC | PRN

## 2017-04-15 NOTE — Progress Notes (Addendum)
  Subjective: 1 Day Post-Op Procedure(s) (LRB): COMPUTER ASSISTED TOTAL KNEE ARTHROPLASTY (Left) Patient reports pain as mild.   Patient is well, and has had no acute complaints or problems PT and Care management to assist with discharge planning. Negative for chest pain and shortness of breath Fever: no Gastrointestinal:Negative for nausea and vomiting  Objective: Vital signs in last 24 hours: Temp:  [97.2 F (36.2 C)-98.4 F (36.9 C)] 98.4 F (36.9 C) (11/22 0754) Pulse Rate:  [65-81] 72 (11/22 0754) Resp:  [15-25] 20 (11/22 0754) BP: (121-180)/(49-87) 129/63 (11/22 0754) SpO2:  [94 %-100 %] 96 % (11/22 0754) Weight:  [83.5 kg (184 lb)] 83.5 kg (184 lb) (11/21 1019)  Intake/Output from previous day:  Intake/Output Summary (Last 24 hours) at 04/15/2017 0859 Last data filed at 04/15/2017 2202 Gross per 24 hour  Intake 2996.67 ml  Output 1830 ml  Net 1166.67 ml    Intake/Output this shift: No intake/output data recorded.  Labs: No results for input(s): HGB in the last 72 hours. No results for input(s): WBC, RBC, HCT, PLT in the last 72 hours. Recent Labs    04/15/17 0406  NA 129*  K 4.1  CL 97*  CO2 25  BUN 9  CREATININE 0.47  GLUCOSE 96  CALCIUM 8.4*   No results for input(s): LABPT, INR in the last 72 hours.   EXAM General - Patient is Alert, Appropriate and Oriented Extremity - ABD soft Sensation intact distally Intact pulses distally Incision: dressing C/D/I No cellulitis present Dressing/Incision - clean, dry, no drainage, bulky dressing intact and hemovac in place. Motor Function - intact, moving foot and toes well on exam.  Abdomen soft with normal BS.  Past Medical History:  Diagnosis Date  . Allergy   . Cataract cortical, senile   . Celiac disease   . Chicken pox   . Collagenous colitis   . Degenerative joint disease   . Diverticulosis   . Family history of adverse reaction to anesthesia    daughter climbs the walls with anesthesia  .  GERD (gastroesophageal reflux disease)   . Hypertension   . Hypothyroidism   . Restless leg     Assessment/Plan: 1 Day Post-Op Procedure(s) (LRB): COMPUTER ASSISTED TOTAL KNEE ARTHROPLASTY (Left) Active Problems:   S/P total knee arthroplasty  Estimated body mass index is 30.15 kg/m as calculated from the following:   Height as of this encounter: 5' 5.5" (1.664 m).   Weight as of this encounter: 83.5 kg (184 lb). Advance diet Up with therapy D/C IV fluids when tolerating po intake.  Labs reviewed, Na 129, encouraged increased oral intake, will d/c IVF today.  BMP tomorrow morning. K+ 4.1, continue to monitor. Will change bulky dressing tomorrow and removed hemovac tomorrow. Up with PT today, begin working on having a BM. Plan for possible discharge tomorrow vs. Saturday based on progress with PT.  DVT Prophylaxis - Lovenox, Foot Pumps and TED hose Weight-Bearing as tolerated to left leg  J. Cameron Proud, PA-C [facility] 04/15/2017, 8:59 AM

## 2017-04-15 NOTE — Evaluation (Signed)
Physical Therapy Evaluation Patient Details Name: Deborah MOOERS MRN: [id number] DOB: 30-Dec-1934 Today's Date: 04/15/2017   History of Present Illness  Patient is a pleasant 82 y/o female that presents after L TKR, on 04/14/2017.   Clinical Impression  Patient is seen POD#1 after L TKR, she is eager to work with therapy, however once she is on bedside commode after standing and short ambulation she begins vomiting. She was provided with anti-nausea medication from RN, however further mobility is not indicated at this time. She demonstrated excellent quadriceps control in brief bout of ambulation and was noted to have appropriate knee flexion (65 degrees) this date. She required min A for transfers secondary to pain/weakness in her knee extensors at this time. Given her presentation this date, she is likely more appropriate for SNF placement once medically stable for discharge.     Follow Up Recommendations SNF    Equipment Recommendations  Rolling walker with 5" wheels    Recommendations for Other Services       Precautions / Restrictions Precautions Precautions: Fall Restrictions Weight Bearing Restrictions: Yes LLE Weight Bearing: Weight bearing as tolerated      Mobility  Bed Mobility Overal bed mobility: Needs Assistance Bed Mobility: Sit to Supine       Sit to supine: Min guard;Min assist   General bed mobility comments: Patient was sitting at EOB prior to therapist entering the room, after using the commode she required very minimal assistance to bring her LLE back onto bed surface.   Transfers Overall transfer level: Needs assistance Equipment used: Rolling walker (2 wheeled) Transfers: Sit to/from Stand Sit to Stand: Min guard;Min assist         General transfer comment: Patient required cuing for hand positioning and technique for completing sit to stand transfers, no buckling noted and very minimal assistance required to complete transfer.    Ambulation/Gait Ambulation/Gait assistance: Supervision;Min guard Ambulation Distance (Feet): 6 Feet Assistive device: Rolling walker (2 wheeled) Gait Pattern/deviations: Step-to pattern   Gait velocity interpretation: <1.8 ft/sec, indicative of risk for recurrent falls General Gait Details: Patient demonstrates good quadriceps control in limited bout of walking, therapist declined further ambulation as patient became nauseated and began vomiting while on commode.   Stairs            Wheelchair Mobility    Modified Rankin (Stroke Patients Only)       Balance Overall balance assessment: Needs assistance Sitting-balance support: Bilateral upper extremity supported Sitting balance-Leahy Scale: Good     Standing balance support: Bilateral upper extremity supported Standing balance-Leahy Scale: Fair                               Pertinent Vitals/Pain Pain Assessment: Faces Faces Pain Scale: Hurts little more Pain Location: L knee  Pain Descriptors / Indicators: Sore;Aching;Operative site guarding Pain Intervention(s): Limited activity within patient's tolerance;Monitored during session;Premedicated before session;Repositioned;Ice applied    Home Living Family/patient expects to be discharged to:: Skilled nursing facility Living Arrangements: Alone             Home Equipment: Walker - 2 wheels      Prior Function Level of Independence: Independent         Comments: Patient reports she had been driving prior to admission, has a RW at home but does not always use it.      Hand Dominance        Extremity/Trunk Assessment  Upper Extremity Assessment Upper Extremity Assessment: Overall WFL for tasks assessed    Lower Extremity Assessment Lower Extremity Assessment: LLE deficits/detail LLE Deficits / Details: Patient is able to complete 10 SLRs without PT assistance       Communication   Communication: No difficulties  Cognition  Arousal/Alertness: Awake/alert Behavior During Therapy: WFL for tasks assessed/performed Overall Cognitive Status: Within Functional Limits for tasks assessed                                        General Comments General comments (skin integrity, edema, etc.): Lines/leads in place throughout session.     Exercises Total Joint Exercises Hip ABduction/ADduction: AROM;15 reps;Both Straight Leg Raises: AROM;Both;15 reps Knee Flexion: AROM;AAROM;15 reps;Both Goniometric ROM: 4-65 Other Exercises Other Exercises: Provided cuing and set up assistance for patient to complete transfer to bedside commode with min guard assist and RW.    Assessment/Plan    PT Assessment Patient needs continued PT services  PT Problem List Decreased strength;Decreased mobility;Decreased knowledge of precautions;Decreased activity tolerance;Decreased balance;Decreased knowledge of use of DME;Pain;Decreased range of motion       PT Treatment Interventions DME instruction;Therapeutic activities;Gait training;Therapeutic exercise;Stair training;Balance training;Neuromuscular re-education;Manual techniques    PT Goals (Current goals can be found in the Care Plan section)  Acute Rehab PT Goals Patient Stated Goal: To return home safely.  PT Goal Formulation: With patient Time For Goal Achievement: 04/29/17 Potential to Achieve Goals: Good    Frequency BID   Barriers to discharge        Co-evaluation               AM-PAC PT "6 Clicks" Daily Activity  Outcome Measure Difficulty turning over in bed (including adjusting bedclothes, sheets and blankets)?: A Little Difficulty moving from lying on back to sitting on the side of the bed? : A Little Difficulty sitting down on and standing up from a chair with arms (e.g., wheelchair, bedside commode, etc,.)?: A Little Help needed moving to and from a bed to chair (including a wheelchair)?: A Lot Help needed walking in hospital room?: A  Lot Help needed climbing 3-5 steps with a railing? : A Lot 6 Click Score: 15    End of Session Equipment Utilized During Treatment: Gait belt Activity Tolerance: Patient tolerated treatment well Patient left: in bed;with call bell/phone within reach;with bed alarm set;with family/visitor present Nurse Communication: Mobility status(RN present for patient vomiting, provided medication) PT Visit Diagnosis: Muscle weakness (generalized) (M62.81)    Time: 5170-0174 PT Time Calculation (min) (ACUTE ONLY): 20 min   Charges:   PT Evaluation $PT Eval Moderate Complexity: 1 Mod PT Treatments $Therapeutic Activity: 8-22 mins   PT G Codes:   PT G-Codes **NOT FOR INPATIENT CLASS** Functional Assessment Tool Used: AM-PAC 6 Clicks Basic Mobility Functional Limitation: Mobility: Walking and moving around Mobility: Walking and Moving Around Current Status (B4496): At least 40 percent but less than 60 percent impaired, limited or restricted Mobility: Walking and Moving Around Goal Status [phone]): At least 20 percent but less than 40 percent impaired, limited or restricted   Royce Macadamia PT, DPT, CSCS     04/15/2017, 3:32 PM

## 2017-04-15 NOTE — Progress Notes (Signed)
Pt with nausea and vomiting x1. Zofran IV given.

## 2017-04-15 NOTE — Clinical Social Work Note (Signed)
CSW received consult for placement. CSW will follow pending PT recommendations.  Santiago Bumpers, MSW, Latanya Presser [phone]

## 2017-04-16 LAB — CBC
HEMATOCRIT: 26.8 % — AB (ref 35.0–47.0)
HEMOGLOBIN: 9.1 g/dL — AB (ref 12.0–16.0)
MCH: 29.5 pg (ref 26.0–34.0)
MCHC: 34 g/dL (ref 32.0–36.0)
MCV: 86.9 fL (ref 80.0–100.0)
Platelets: 174 10*3/uL (ref 150–440)
RBC: 3.09 MIL/uL — ABNORMAL LOW (ref 3.80–5.20)
RDW: 15 % — ABNORMAL HIGH (ref 11.5–14.5)
WBC: 4.8 10*3/uL (ref 3.6–11.0)

## 2017-04-16 LAB — BASIC METABOLIC PANEL
ANION GAP: 9 (ref 5–15)
BUN: 9 mg/dL (ref 6–20)
CHLORIDE: 93 mmol/L — AB (ref 101–111)
CO2: 23 mmol/L (ref 22–32)
Calcium: 8.4 mg/dL — ABNORMAL LOW (ref 8.9–10.3)
Creatinine, Ser: 0.55 mg/dL (ref 0.44–1.00)
GFR calc Af Amer: >60 mL/min (ref >60)
GFR calc non Af Amer: >60 mL/min (ref >60)
GLUCOSE: 116 mg/dL — AB (ref 65–99)
POTASSIUM: 3.6 mmol/L (ref 3.5–5.1)
Sodium: 125 mmol/L — ABNORMAL LOW (ref 135–145)

## 2017-04-16 LAB — SODIUM: Sodium: 122 mmol/L — ABNORMAL LOW (ref 135–145)

## 2017-04-16 LAB — TSH: TSH: 2.838 u[IU]/mL (ref 0.350–4.500)

## 2017-04-16 MED ORDER — ENOXAPARIN SODIUM 40 MG/0.4ML ~~LOC~~ SOLN: 40.0000 mg | SUBCUTANEOUS | 0 refills | Status: DC

## 2017-04-16 MED ORDER — OXYCODONE HCL 5 MG PO TABS: 5.0000 mg | ORAL_TABLET | ORAL | 0 refills | Status: DC | PRN

## 2017-04-16 MED ORDER — ONDANSETRON HCL 4 MG PO TABS: 4.0000 mg | ORAL_TABLET | Freq: Four times a day (QID) | ORAL | 0 refills | Status: DC | PRN

## 2017-04-16 MED ORDER — SODIUM CHLORIDE 0.9 % IV SOLN
Freq: Once | INTRAVENOUS | Status: AC
  Administered 2017-04-16: 11:00:00 via INTRAVENOUS

## 2017-04-16 NOTE — Progress Notes (Signed)
Pt did not have BM yet post surgery. This Probation officer offered PRN laxative (Milk of Magnesia) but pt refused to take it this time due to history of IBS. Pt stated she will take it after breakfast.

## 2017-04-16 NOTE — Clinical Social Work Placement (Signed)
   CLINICAL SOCIAL WORK PLACEMENT  NOTE  Date:  04/16/2017  Patient Details  Name: Deborah Deleon MRN: [id number] Date of Birth: [birth year]  Clinical Social Work is seeking post-discharge placement for this patient at the [place] level of care (*CSW will initial, date and re-position this form in  chart as items are completed):  Yes   Patient/family provided with [facility] Work Department's list of facilities offering this level of care within the geographic area requested by the patient (or if unable, by the patient's family).  Yes   Patient/family informed of their freedom to choose among providers that offer the needed level of care, that participate in Medicare, Medicaid or managed care program needed by the patient, have an available bed and are willing to accept the patient.  Yes   Patient/family informed of [facility]'s ownership interest in [facility] and [facility], as well as of the fact that they are under no obligation to receive care at these facilities.  PASRR submitted to EDS on 04/16/17     PASRR number received on 04/16/17     Existing PASRR number confirmed on       FL2 transmitted to all facilities in geographic area requested by pt/family on 04/16/17     FL2 transmitted to all facilities within larger geographic area on       Patient informed that his/her managed care company has contracts with or will negotiate with certain facilities, including the following:        Yes   Patient/family informed of bed offers received.  Patient chooses bed at ([facility] )     Physician recommends and patient chooses bed at      Patient to be transferred to   on  .  Patient to be transferred to facility by       Patient family notified on   of transfer.  Name of family member notified:        PHYSICIAN       Additional Comment:    _______________________________________________ Euell Schiff, Veronia Beets, LCSW 04/16/2017, 10:46  PM

## 2017-04-16 NOTE — Discharge Summary (Addendum)
Physician Discharge Summary  Patient ID: Deborah Deleon MRN: [id number] DOB/AGE: 06-04-1934 82 y.o.  Admit date: 04/14/2017 Discharge date: 04/18/17  Admission Diagnoses:  primary osteoarthritis of left knee Degenerative arthrosis of the left knee, primary.  Discharge Diagnoses: Patient Active Problem List   Diagnosis Date Noted  . S/P total knee arthroplasty 04/14/2017  . Primary osteoarthritis of left knee 04/04/2017  . OSA on CPAP 03/22/2017  . Other and unspecified hyperlipidemia 02/18/2017  . Venous stasis 07/10/2016  . CAD (coronary artery disease) 06/18/2016  . Aortic insufficiency 06/18/2016  . Mitral regurgitation 06/18/2016  . Essential hypertension 12/22/2015  . Collagenous colitis 12/22/2015  . Celiac disease 12/22/2015  . Hypothyroidism 01/09/2015  . Restless leg syndrome 01/09/2015  . Pulmonary nodule 11/27/2013  . GERD (gastroesophageal reflux disease) 11/13/2013  Degenerative arthrosis of the left knee, primary. Hyponatremia  Past Medical History:  Diagnosis Date  . Allergy   . Cataract cortical, senile   . Celiac disease   . Chicken pox   . Collagenous colitis   . Degenerative joint disease   . Diverticulosis   . Family history of adverse reaction to anesthesia    daughter climbs the walls with anesthesia  . GERD (gastroesophageal reflux disease)   . Hypertension   . Hypothyroidism   . Restless leg      Transfusion: None.   Consultants (if any): Treatment Team:  Max Sane, MD  Discharged Condition: Improved  Hospital Course: Deborah Deleon is an 82 y.o. female who was admitted 04/14/2017 with a diagnosis of  and went to the operating room on 04/14/2017 and underwent the above named procedures.    Surgeries: Procedure(s): COMPUTER ASSISTED TOTAL KNEE ARTHROPLASTY on 04/14/2017 Patient tolerated the surgery well. Taken to PACU where she was stabilized and then transferred to the orthopedic floor.  Started on Lovenox 30mg  q 12 hrs.  Foot pumps applied bilaterally at 80 mm. Heels elevated on bed with rolled towels. No evidence of DVT. Negative Homan. Physical therapy started on day #1 for gait training and transfer. OT started day #1 for ADL and assisted devices.  Patient's IV was continued at times until POD2 due to hyponatremia related to volume loss.  Hyponatremia did not improve with gentle IV hydration, internal medicine consulted and she was placed on a fluid restricted diet and sodium Bicarb.  On discharge, Na 122 and patient asymptomatic, will continue to monitor the Sodium at rehab facility.  Hemovac was removed on POD2.  Foley removed on POD1.  Implants: DePuy Attune size 7 posterior stabilized femoral component (cemented), size 6 rotating platform tibial component (cemented), 38 mm medialized dome patella (cemented), and a 5 mm stabilized rotating platform polyethylene insert.  She was given perioperative antibiotics:  Anti-infectives (From admission, onward)   Start     Dose/Rate Route Frequency Ordered Stop   04/14/17 1730  clindamycin (CLEOCIN) IVPB 600 mg     600 mg 100 mL/hr over 30 Minutes Intravenous Every 6 hours 04/14/17 1729 04/15/17 1300   04/14/17 1012  clindamycin (CLEOCIN) 900 MG/50ML IVPB    Comments:  Ronnell [facility]   : cabinet override      04/14/17 1012 04/14/17 1228   04/14/17 0600  clindamycin (CLEOCIN) IVPB 900 mg     900 mg 100 mL/hr over 30 Minutes Intravenous On call to O.R. 04/13/17 2131 04/14/17 1243    .  She was given sequential compression devices, early ambulation, and Lovenox for DVT prophylaxis.  She benefited maximally from the hospital  stay and there were no complications.    Recent vital signs:  Vitals:   04/16/17 1949 04/17/17 0728  BP: (!) 158/78 (!) 147/68  Pulse: 83 89  Resp: 18 18  Temp: (!) 97.5 F (36.4 C) 98.4 F (36.9 C)  SpO2: 95% 93%    Recent laboratory studies:  Lab Results  Component Value Date   HGB 9.2 (L) 04/17/2017   HGB 9.1 (L)  04/16/2017   HGB 10.3 (L) 04/07/2017   Lab Results  Component Value Date   WBC 4.1 04/17/2017   PLT 181 04/17/2017   Lab Results  Component Value Date   INR 0.94 04/07/2017   Lab Results  Component Value Date   NA 122 (L) 04/17/2017   K 4.1 04/17/2017   CL 88 (L) 04/17/2017   CO2 28 04/17/2017   BUN 7 04/17/2017   CREATININE 0.47 04/17/2017   GLUCOSE 108 (H) 04/17/2017    Discharge Medications:   Allergies as of 04/17/2017      Reactions   Penicillins Swelling   Has patient had a PCN reaction causing immediate rash, facial/tongue/throat swelling, SOB or lightheadedness with hypotension: Yes Has patient had a PCN reaction causing severe rash involving mucus membranes or skin necrosis: No Has patient had a PCN reaction that required hospitalization: No Has patient had a PCN reaction occurring within the last 10 years: No If all of the above answers are "NO", then may proceed with Cephalosporin use.   Sulfa Antibiotics Rash      Medication List    TAKE these medications   acetaminophen 325 MG tablet Commonly known as:  TYLENOL Take 650 mg every 6 (six) hours as needed by mouth for mild pain.   aspirin EC 81 MG tablet Take 81 mg 2 (two) times daily by mouth.   enoxaparin 40 MG/0.4ML injection Commonly known as:  LOVENOX Inject 0.4 mLs (40 mg total) into the skin daily.   fluticasone 50 MCG/ACT nasal spray Commonly known as:  FLONASE Place 2 sprays into both nostrils daily. What changed:  how much to take   furosemide 20 MG tablet Commonly known as:  LASIX Take 1 tablet (20 mg total) by mouth daily.   levocetirizine 5 MG tablet Commonly known as:  XYZAL Take 1 tablet (5 mg total) by mouth every evening. What changed:    when to take this  reasons to take this   levothyroxine 125 MCG tablet Commonly known as:  SYNTHROID, LEVOTHROID Take 1 tablet (125 mcg total) by mouth daily with breakfast.   metoprolol succinate 25 MG 24 hr tablet Commonly known as:   TOPROL-XL Take 25 mg daily by mouth.   montelukast 10 MG tablet Commonly known as:  SINGULAIR Take 1 tablet (10 mg total) by mouth every morning.   ondansetron 4 MG tablet Commonly known as:  ZOFRAN Take 1 tablet (4 mg total) by mouth every 6 (six) hours as needed for nausea.   oxyCODONE 5 MG immediate release tablet Commonly known as:  Oxy IR/ROXICODONE Take 1-2 tablets (5-10 mg total) by mouth every 4 (four) hours as needed for severe pain.   pantoprazole 40 MG tablet Commonly known as:  PROTONIX Take 1 tablet (40 mg total) by mouth daily. What changed:  when to take this   potassium chloride 10 MEQ tablet Commonly known as:  K-DUR Take 2 tablets (20 mEq total) by mouth daily. What changed:  how much to take   rOPINIRole 0.5 MG tablet Commonly known as:  REQUIP Take 0.5 mg 2 (two) times daily by mouth. Take one tablet in the afternoon and two tablets at bedtime            Durable Medical Equipment  (From admission, onward)        Start     Ordered   04/14/17 1730  DME Walker rolling  Once    Question:  Patient needs a walker to treat with the following condition  Answer:  Total knee replacement status   04/14/17 1729   04/14/17 1730  DME Bedside commode  Once    Question:  Patient needs a bedside commode to treat with the following condition  Answer:  Total knee replacement status   04/14/17 1729      Diagnostic Studies: Dg Knee Left Port  Result Date: 04/14/2017 CLINICAL DATA:  Status post left knee joint replacement. EXAM: PORTABLE LEFT KNEE - 1-2 VIEW COMPARISON:  MRI of the left knee dated February 25, 2017. FINDINGS: The patient has undergone left total knee joint prosthesis placement. Radiographic positioning of the prosthetic components is good. The native bone exhibits no acute abnormality. Orthopedic drain lines and skin staples are present. IMPRESSION: No immediate postprocedure complication following left total knee joint prosthesis placement.  Electronically Signed   By: David  Martinique M.D.   On: 04/14/2017 16:23   Disposition:  Plan is for discharge to SNF on 04/17/17 on a fluid restricted diet.   Contact information for follow-up providers    Lattie Corns, PA-C Follow up on 04/30/2017.   Specialty:  Physician Assistant Why:  at 10:15am Contact information: [place] [id number] [phone]        Dereck Leep, MD Follow up on 06/01/2017.   Specialty:  Orthopedic Surgery Why:  at 9:30am Contact information: [address] [facility] [place] [address] [phone]            Contact information for after-discharge care    Destination    [facility] Follow up.   Service:  Skilled Nursing Contact information: [address] [place] [phone]                 Signed: Judson Roch PA-C 04/17/2017, 7:42 AM

## 2017-04-16 NOTE — Evaluation (Signed)
Occupational Therapy Evaluation Patient Details Name: Deborah Deleon MRN: [id number] DOB: [birth year] Today's Date: 04/16/2017    History of Present Illness Patient is a pleasant 82 y/o female that presents after L TKR, on 04/14/2017.    Clinical Impression   Pt is 82 year old female s/p L TKR.  Pt was independent in all ADLs prior to surgery and is eager to return to PLOF.  Pt currently requires minimal to moderate assist for LB dressing while in seated position due to pain and limited AROM of L knee. Pt verbalized understanding of education provided in AE/DME for LB dressing, bathing, and toileting tasks, polar care mgt, and compression stocking mgt. Pt would benefit from additional instruction in dressing techniques with or without assistive devices for dressing and bathing skills. Pt would also benefit from recommendations for home modifications to increase safety in the bathroom and prevent falls. Recommend STR following hospitalization.      Follow Up Recommendations  SNF    Equipment Recommendations  3 in 1 bedside commode    Recommendations for Other Services       Precautions / Restrictions Precautions Precautions: Fall Restrictions Weight Bearing Restrictions: Yes LLE Weight Bearing: Weight bearing as tolerated      Mobility Bed Mobility Overal bed mobility: Modified Independent         Sit to supine: Modified independent (Device/Increase time)   General bed mobility comments: deferred, pt up in recliner for session  Transfers Overall transfer level: Needs assistance Equipment used: Rolling walker (2 wheeled) Transfers: Sit to/from Stand Sit to Stand: Min guard         General transfer comment: VC for hand placement    Balance Overall balance assessment: Needs assistance Sitting-balance support: Feet supported;No upper extremity supported Sitting balance-Leahy Scale: Good     Standing balance support: Bilateral upper extremity supported Standing  balance-Leahy Scale: Fair                             ADL either performed or assessed with clinical judgement   ADL Overall ADL's : Needs assistance/impaired Eating/Feeding: Sitting;Set up   Grooming: Sitting;Set up   Upper Body Bathing: Sitting;Set up   Lower Body Bathing: Sit to/from stand;Minimal assistance;Moderate assistance   Upper Body Dressing : Sitting;Set up   Lower Body Dressing: Sit to/from stand;Moderate assistance;Minimal assistance   Toilet Transfer: RW;Min guard;Comfort height toilet;Ambulation           Functional mobility during ADLs: Min guard;Rolling walker General ADL Comments: Pt verbalized understanding of education provided in AE/DME for LB dressing, bathing, and toileting tasks.     Vision Baseline Vision/History: Wears glasses Wears Glasses: Reading only Patient Visual Report: No change from baseline       Perception     Praxis      Pertinent Vitals/Pain Pain Assessment: 0-10 Pain Score: 6  Pain Location: L knee  Pain Descriptors / Indicators: Aching Pain Intervention(s): Limited activity within patient's tolerance;Monitored during session;Ice applied     Hand Dominance     Extremity/Trunk Assessment Upper Extremity Assessment Upper Extremity Assessment: Overall WFL for tasks assessed   Lower Extremity Assessment Lower Extremity Assessment: Defer to PT evaluation;LLE deficits/detail LLE Deficits / Details: expected post-op ROM/strength deficits   Cervical / Trunk Assessment Cervical / Trunk Assessment: Normal   Communication Communication Communication: No difficulties   Cognition Arousal/Alertness: Lethargic Behavior During Therapy: WFL for tasks assessed/performed Overall Cognitive Status: Within Functional Limits for  tasks assessed                                 General Comments: pt very sleepy during session, difficulty keeping eyes open   General Comments       Exercises Other  Exercises Other Exercises: Pt verbalized understanding of education provided for compression stocking mgt and polar care mgt.    Shoulder Instructions      Home Living Family/patient expects to be discharged to:: Skilled nursing facility Living Arrangements: Alone                           Home Equipment: Walker - 2 wheels;Grab bars - tub/shower   Additional Comments: pt reports living alone in ILF apartment with walk in shower and comfort height toilet      Prior Functioning/Environment Level of Independence: Independent        Comments: Patient reports she had been driving prior to admission, has a RW at home but does not always use it. No falls in past 12 months. Indep with ADL.        OT Problem List: Decreased strength;Decreased knowledge of use of DME or AE;Decreased range of motion;Impaired balance (sitting and/or standing)      OT Treatment/Interventions: Self-care/ADL training;Therapeutic exercise;Therapeutic activities;DME and/or AE instruction;Patient/family education    OT Goals(Current goals can be found in the care plan section) Acute Rehab OT Goals Patient Stated Goal: return to PLOF OT Goal Formulation: With patient Time For Goal Achievement: 04/30/17 Potential to Achieve Goals: Good  OT Frequency: Min 1X/week   Barriers to D/C: Decreased caregiver support          Co-evaluation              AM-PAC PT "6 Clicks" Daily Activity     Outcome Measure Help from another person eating meals?: None Help from another person taking care of personal grooming?: None Help from another person toileting, which includes using toliet, bedpan, or urinal?: A Little Help from another person bathing (including washing, rinsing, drying)?: A Lot Help from another person to put on and taking off regular upper body clothing?: A Little Help from another person to put on and taking off regular lower body clothing?: A Lot 6 Click Score: 18   End of Session     Activity Tolerance: Patient tolerated treatment well Patient left: in chair;with call bell/phone within reach;with chair alarm set;Other (comment);with SCD's reapplied(polar care in place)  OT Visit Diagnosis: Other abnormalities of gait and mobility (R26.89)                Time: 1022-1040 OT Time Calculation (min): 18 min Charges:  OT General Charges $OT Visit: 1 Visit OT Evaluation $OT Eval Low Complexity: 1 Low OT Treatments $Self Care/Home Management : 8-22 mins G-Codes: OT G-codes **NOT FOR INPATIENT CLASS** Functional Assessment Tool Used: AM-PAC 6 Clicks Daily Activity;Clinical judgement Functional Limitation: Self care Self Care Current Status (P3295): At least 40 percent but less than 60 percent impaired, limited or restricted Self Care Goal Status (J8841): At least 20 percent but less than 40 percent impaired, limited or restricted   Jeni Salles, MPH, MS, OTR/L ascom [phone] 04/16/17, 11:52 AM

## 2017-04-16 NOTE — Progress Notes (Signed)
Physical Therapy Treatment Patient Details Name: Deborah Deleon MRN: [id number] DOB: [birth year] Today's Date: 04/16/2017    History of Present Illness Patient is a pleasant 82 y/o female that presents after L TKR, on 04/14/2017.     PT Comments    Patient with progressive increase in gait distance and overall activity tolerance this date, requiring cga/min assist for all functional activities.  Gait distance limited by reports of SOB, but sats remain >93-94%.  Good L knee quad activation and control; min cuing for TKE in loading phase of gait activities to optimize stability.    Follow Up Recommendations  SNF     Equipment Recommendations  Rolling walker with 5" wheels    Recommendations for Other Services       Precautions / Restrictions Precautions Precautions: Fall Restrictions Weight Bearing Restrictions: Yes LLE Weight Bearing: Weight bearing as tolerated    Mobility  Bed Mobility Overal bed mobility: Modified Independent         Sit to supine: Modified independent (Device/Increase time)   General bed mobility comments: negotiates L LE without assist this date  Transfers Overall transfer level: Needs assistance Equipment used: Rolling walker (2 wheeled) Transfers: Sit to/from Stand Sit to Stand: Min guard         General transfer comment: cuing for hand placement to prevent pulling on RW  Ambulation/Gait Ambulation/Gait assistance: Min guard Ambulation Distance (Feet): 30 Feet Assistive device: Rolling walker (2 wheeled)       General Gait Details: 3-point, step to gait pattern with decreased stance time/weight shift to L LE; mod WBing bilat Ues.  Min cuing for L quad set in loading, decreased force of contact R LE (to faciltiate increased weight shift to L LE) and increased cadence.  Fatigues quickly with mild/mod reports of SOB (sats >93-94% on RA); declines additional distance as result   Stairs            Wheelchair Mobility     Modified Rankin (Stroke Patients Only)       Balance Overall balance assessment: Needs assistance Sitting-balance support: Feet supported;No upper extremity supported Sitting balance-Leahy Scale: Good     Standing balance support: Bilateral upper extremity supported Standing balance-Leahy Scale: Fair                              Cognition Arousal/Alertness: Awake/alert Behavior During Therapy: WFL for tasks assessed/performed Overall Cognitive Status: Within Functional Limits for tasks assessed                                        Exercises Total Joint Exercises Goniometric ROM: L knee: 3-80 degrees Other Exercises Other Exercises: Supine LE therex, 1x10, AROM for muscular strength/endurance with functional activities: ankle pumps, SAQs, heel slides, SLR.  Good quad control and activation noted with all supine activities.    General Comments        Pertinent Vitals/Pain Pain Assessment: 0-10 Pain Score: 5  Pain Location: L knee  Pain Descriptors / Indicators: Sore;Aching;Operative site guarding Pain Intervention(s): Limited activity within patient's tolerance;Monitored during session;Repositioned    Home Living                      Prior Function            PT Goals (current goals can now be found in  the care plan section) Acute Rehab PT Goals Patient Stated Goal: To return home safely.  PT Goal Formulation: With patient Time For Goal Achievement: 04/29/17 Potential to Achieve Goals: Good Progress towards PT goals: Progressing toward goals    Frequency    BID      PT Plan Current plan remains appropriate    Co-evaluation              AM-PAC PT "6 Clicks" Daily Activity  Outcome Measure  Difficulty turning over in bed (including adjusting bedclothes, sheets and blankets)?: A Little Difficulty moving from lying on back to sitting on the side of the bed? : A Little Difficulty sitting down on and  standing up from a chair with arms (e.g., wheelchair, bedside commode, etc,.)?: Unable Help needed moving to and from a bed to chair (including a wheelchair)?: A Little Help needed walking in hospital room?: A Little Help needed climbing 3-5 steps with a railing? : A Lot 6 Click Score: 15    End of Session Equipment Utilized During Treatment: Gait belt Activity Tolerance: Patient tolerated treatment well Patient left: with call bell/phone within reach;with chair alarm set;in chair;with family/visitor present Nurse Communication: Mobility status PT Visit Diagnosis: Muscle weakness (generalized) (M62.81);Pain;Difficulty in walking, not elsewhere classified (R26.2) Pain - Right/Left: Left Pain - part of body: Knee     Time: 1610-9604 PT Time Calculation (min) (ACUTE ONLY): 36 min  Charges:  $Gait Training: 8-22 mins $Therapeutic Exercise: 8-22 mins                    G Codes:       Divya Munshi H. Owens Shark, PT, DPT, NCS 04/16/17, 11:03 AM [phone]

## 2017-04-16 NOTE — Progress Notes (Signed)
Physical Therapy Treatment Patient Details Name: Deborah Deleon MRN: [id number] DOB: Feb 28, 1935 Today's Date: 04/16/2017    History of Present Illness Patient is a pleasant 82 y/o female that presents after L TKR, on 04/14/2017.     PT Comments    Gradual increase in gait distance, but seemingly self-limiting with overall performance and activity tolerance.  Fair/good L LE mechanics and overall control (remains very guarded against flexion, esp in WBing position), but quick to request rest period/limit activity with onset of fatigue.  Moderate encouragement throughout session.    Follow Up Recommendations  SNF     Equipment Recommendations  Rolling walker with 5" wheels    Recommendations for Other Services       Precautions / Restrictions Precautions Precautions: Fall Restrictions Weight Bearing Restrictions: Yes LLE Weight Bearing: Weight bearing as tolerated    Mobility  Bed Mobility Overal bed mobility: Modified Independent Bed Mobility: Supine to Sit     Supine to sit: Modified independent (Device/Increase time)     General bed mobility comments: increased time required  Transfers Overall transfer level: Needs assistance Equipment used: Rolling walker (2 wheeled) Transfers: Sit to/from Stand Sit to Stand: Min assist         General transfer comment: cuing for hand placement; limited carry-over from AM session  Ambulation/Gait Ambulation/Gait assistance: Min guard Ambulation Distance (Feet): 45 Feet Assistive device: Rolling walker (2 wheeled)     Gait velocity interpretation: Below normal speed for age/gender General Gait Details: mixed step to and step through gait pattern with very abbreviated L LE stance time; multiple standing rest periods required to complete distance.  Very slow and guarded; limited abiltiy to correct any unexpected weight shift/LOB.   Stairs            Wheelchair Mobility    Modified Rankin (Stroke Patients Only)       Balance Overall balance assessment: Needs assistance Sitting-balance support: No upper extremity supported;Feet supported Sitting balance-Leahy Scale: Good     Standing balance support: Bilateral upper extremity supported Standing balance-Leahy Scale: Fair                              Cognition Arousal/Alertness: Awake/alert Behavior During Therapy: WFL for tasks assessed/performed Overall Cognitive Status: Within Functional Limits for tasks assessed                                        Exercises Other Exercises Other Exercises: Toilet transfer, ambulatory with RW, cga/min assist; sit/stand from [facility] with RW, cga.  Standing balance at sink for hand hygiene, cga/min assist-min cuing for walker placement (tends to place and step away from); functional reach approx 2-3" from immediate BOS.    General Comments        Pertinent Vitals/Pain Pain Assessment: 0-10 Faces Pain Scale: Hurts little more Pain Location: L knee  Pain Descriptors / Indicators: Aching;Grimacing;Guarding Pain Intervention(s): Limited activity within patient's tolerance;Monitored during session;Repositioned;Premedicated before session    Home Living                      Prior Function            PT Goals (current goals can now be found in the care plan section) Acute Rehab PT Goals Patient Stated Goal: return to PLOF PT Goal Formulation: With patient Time  For Goal Achievement: 04/29/17 Potential to Achieve Goals: Good Progress towards PT goals: Progressing toward goals    Frequency    BID      PT Plan Current plan remains appropriate    Co-evaluation              AM-PAC PT "6 Clicks" Daily Activity  Outcome Measure  Difficulty turning over in bed (including adjusting bedclothes, sheets and blankets)?: A Little Difficulty moving from lying on back to sitting on the side of the bed? : A Little Difficulty sitting down on and standing up from  a chair with arms (e.g., wheelchair, bedside commode, etc,.)?: Unable Help needed moving to and from a bed to chair (including a wheelchair)?: A Little Help needed walking in hospital room?: A Little Help needed climbing 3-5 steps with a railing? : A Lot 6 Click Score: 15    End of Session Equipment Utilized During Treatment: Gait belt Activity Tolerance: Patient tolerated treatment well Patient left: in chair;with call bell/phone within reach;with chair alarm set;with family/visitor present   PT Visit Diagnosis: Muscle weakness (generalized) (M62.81);Pain;Difficulty in walking, not elsewhere classified (R26.2) Pain - Right/Left: Left Pain - part of body: Knee     Time: 1353-1410 PT Time Calculation (min) (ACUTE ONLY): 17 min  Charges:  $Gait Training: 8-22 mins                    G Codes:       Quatavious Rossa H. Owens Shark, PT, DPT, NCS 04/16/17, 4:46 PM [phone]

## 2017-04-16 NOTE — Clinical Social Work Note (Signed)
Clinical Social Work Assessment  Patient Details  Name: Deborah Deleon MRN: [id number] Date of Birth: 01-Mar-1935  Date of referral:  04/16/17               Reason for consult:  Facility Placement                Permission sought to share information with:  Chartered certified accountant granted to share information::  Yes, Verbal Permission Granted  Name::      [place]::   [place]   Relationship::     Contact Information:     Housing/Transportation Living arrangements for the past 2 months:  [place] of Information:  Patient Patient Interpreter Needed:  None Criminal Activity/Legal Involvement Pertinent to Current Situation/Hospitalization:  No - Comment as needed Significant Relationships:  Siblings Lives with:  Self Do you feel safe going back to the place where you live?  Yes Need for family participation in patient care:  Yes (Comment)  Care giving concerns:  Patient lives at [facility] independent living community in [place].    Social Worker assessment / plan:  Holiday representative (CSW) received SNF consult. PT is recommending SNF. CSW met with patient alone at bedside to address consult. Patient was alert and oriented X4 and was sitting up in the chair at bedside. CSW introduced self and explained role of CSW department. Patient reported that she lives at [facility] independent living connected to [facility]. CSW explained that [facility] is not in network with [facility]. CSW explained SNF process and that [facility] will have to approve SNF. Patient verbalized her understanding and is agreeable to SNF search in [place]. FL2 complete and faxed out.   CSW presented bed offers to patient and she chose [facility]. [facility] SNF authorization has been received, auth # [id number]. CSW sent D/C summary to [facility] today via [facility]. Per [facility] liaison patient can come tomorrow to room 805. CSW  will continue to follow and assist as needed.   Employment status:  Retired Nurse, adult PT Recommendations:  [place] / Referral to community resources:  [place]  Patient/Family's Response to care:  Patient is agreeable to go to [facility] for short term rehab.   Patient/Family's Understanding of and Emotional Response to Diagnosis, Current Treatment, and Prognosis:  Patient was very pleasant and thanked CSW for assistance.   Emotional Assessment Appearance:  Appears stated age Attitude/Demeanor/Rapport:    Affect (typically observed):  Accepting, Adaptable, Pleasant Orientation:  Oriented to Self, Oriented to Place, Oriented to  Time, Oriented to Situation Alcohol / Substance use:  Not Applicable Psych involvement (Current and /or in the community):  No (Comment)  Discharge Needs  Concerns to be addressed:  Discharge Planning Concerns Readmission within the last 30 days:  No Current discharge risk:  Dependent with Mobility Barriers to Discharge:  Continued Medical Work up   [employer], Veronia Beets, LCSW 04/16/2017, 10:48 PM

## 2017-04-16 NOTE — Progress Notes (Signed)
Pt alert and oriented sitting up in chair. Family at bedside. PRN pain medication given. Sodium level dropped to 122. MD Mcghee notified. Orders to put patient on fluid restrictions. Educated pt on fluid restriction. No questions at this time.

## 2017-04-16 NOTE — Progress Notes (Addendum)
  Subjective: 2 Days Post-Op Procedure(s) (LRB): COMPUTER ASSISTED TOTAL KNEE ARTHROPLASTY (Left) Patient reports pain as mild.   Patient is well, and has had no acute complaints or problems Plan at this time is for discharge to SNF when medically appropriate. Negative for chest pain and shortness of breath Fever: no Gastrointestinal:Negative for nausea and vomiting  Objective: Vital signs in last 24 hours: Temp:  [98 F (36.7 C)-98.5 F (36.9 C)] 98.5 F (36.9 C) (11/23 0807) Pulse Rate:  [66-90] 82 (11/23 1028) Resp:  [16-18] 18 (11/22 1933) BP: (116-172)/(48-81) 135/63 (11/23 1028) SpO2:  [90 %-93 %] 92 % (11/23 0807)  Intake/Output from previous day:  Intake/Output Summary (Last 24 hours) at 04/16/2017 1224 Last data filed at 04/16/2017 0900 Gross per 24 hour  Intake 1280 ml  Output 545 ml  Net 735 ml    Intake/Output this shift: Total I/O In: 360 [P.O.:360] Out: 150 [Urine:150]  Labs: Recent Labs    04/16/17 0301  HGB 9.1*   Recent Labs    04/16/17 0301  WBC 4.8  RBC 3.09*  HCT 26.8*  PLT 174   Recent Labs    04/15/17 0406 04/16/17 0301  NA 129* 125*  K 4.1 3.6  CL 97* 93*  CO2 25 23  BUN 9 9  CREATININE 0.47 0.55  GLUCOSE 96 116*  CALCIUM 8.4* 8.4*   No results for input(s): LABPT, INR in the last 72 hours.   EXAM General - Patient is Alert, Appropriate and Oriented Extremity - ABD soft Sensation intact distally Intact pulses distally Incision: dressing C/D/I No cellulitis present Dressing/Incision - clean, dry, no drainage.  Bulky dressing removed today, hemovac removed, 4x4 with tegaderm placed over the drain site.  No bloody drainage present to the honeycomb dressing. Motor Function - intact, moving foot and toes well on exam.  Abdomen soft with normal BS.  Past Medical History:  Diagnosis Date  . Allergy   . Cataract cortical, senile   . Celiac disease   . Chicken pox   . Collagenous colitis   . Degenerative joint disease    . Diverticulosis   . Family history of adverse reaction to anesthesia    daughter climbs the walls with anesthesia  . GERD (gastroesophageal reflux disease)   . Hypertension   . Hypothyroidism   . Restless leg     Assessment/Plan: 2 Days Post-Op Procedure(s) (LRB): COMPUTER ASSISTED TOTAL KNEE ARTHROPLASTY (Left) Active Problems:   S/P total knee arthroplasty  Estimated body mass index is 30.15 kg/m as calculated from the following:   Height as of this encounter: 5' 5.5" (1.664 m).   Weight as of this encounter: 83.5 kg (184 lb). Advance diet Up with therapy   Labs reviewed, Na 125, encouraged increased oral intake, pt reports several episodes of vomiting yesterday.  Most likely slight fluid depletion, vitals are stable, will do gentle IV hydration for two hours today and recheck Na. K+ 3.6, continue to monitor.  May need to add klor-con. Up with PT today, continue to work on BM, will move to FLEET enema if needed today. Plan for discharge tomorrow to SNF.  DVT Prophylaxis - Lovenox, Foot Pumps and TED hose Weight-Bearing as tolerated to left leg  J. Cameron Proud, PA-C [facility] Orthopaedic Surgery 04/16/2017, 12:24 PM

## 2017-04-16 NOTE — Care Management Note (Signed)
Case Management Note  Patient Details  Name: Deborah Deleon MRN: [id number] Date of Birth: 1934-08-19  Subjective/Objective: RNCM consult for discharge planning. PT recommending SNF. RNCM will sign off.                    Action/Plan:   Expected Discharge Date:  04/17/17               Expected Discharge Plan:  Skilled Nursing Facility  In-House Referral:  Clinical Social Work  Discharge planning Services  CM Consult  Post Acute Care Choice:    Choice offered to:     DME Arranged:    DME Agency:     HH Arranged:    [place] Agency:     Status of Service:  Completed, signed off  If discussed at [employer], dates discussed:    Additional Comments:  Jolly Mango, RN 04/16/2017, 9:27 AM

## 2017-04-16 NOTE — Consult Note (Signed)
[place] [facility] [place] NAME: Deborah Deleon    MR#:  [id number]  DATE OF BIRTH:  [birth year]  DATE OF ADMISSION:  04/14/2017  PRIMARY CARE PHYSICIAN: Ellene Route   REQUESTING/REFERRING PHYSICIAN: Lattie Corns, PA-C  CHIEF COMPLAINT:  No chief complaint on file. knee pain HISTORY OF PRESENT ILLNESS:  Deborah Deleon  is a 82 y.o. female with a known history of HTN is admitted for elective Left total knee arthroplasty which was performed on 11/21. We r consulted for Hyponatremia. Patient is asymptomatic. Friend is at bedside. PAST MEDICAL HISTORY:   Past Medical History:  Diagnosis Date  . Allergy   . Cataract cortical, senile   . Celiac disease   . Chicken pox   . Collagenous colitis   . Degenerative joint disease   . Diverticulosis   . Family history of adverse reaction to anesthesia    daughter climbs the walls with anesthesia  . GERD (gastroesophageal reflux disease)   . Hypertension   . Hypothyroidism   . Restless leg     PAST SURGICAL HISTOIRY:   Past Surgical History:  Procedure Laterality Date  . ABDOMINAL HYSTERECTOMY  1971  . APPENDECTOMY  1958  . CATARACT EXTRACTION W/ INTRAOCULAR LENS IMPLANT Bilateral 2010   left done April and right done in May  . CHOLECYSTECTOMY  1979  . COLONOSCOPY WITH PROPOFOL N/A 01/21/2016   Procedure: COLONOSCOPY WITH PROPOFOL;  Surgeon: Lollie Sails, MD;  Location: [facility] ENDOSCOPY;  Service: Endoscopy;  Laterality: N/A;  . KNEE ARTHROSCOPY Left 02/17/2016   Procedure: ARTHROSCOPY KNEE Chondraplasty;  Surgeon: Dereck Leep, MD;  Location: [facility] ORS;  Service: Orthopedics;  Laterality: Left;  . ROTATOR CUFF REPAIR Left 2008  . TONSILLECTOMY      SOCIAL HISTORY:   Social History   Tobacco Use  . Smoking status: Never Smoker  . Smokeless tobacco: Never Used  Substance Use Topics  . Alcohol use: No    FAMILY HISTORY:   Family History  Problem Relation Age of  Onset  . Breast cancer Sister   . Heart disease Mother   . Diabetes Mother   . Lupus Mother   . Arthritis Father   . Heart disease Father   . Aneurysm Father    DRUG ALLERGIES:   Allergies  Allergen Reactions  . Penicillins Swelling    Has patient had a PCN reaction causing immediate rash, facial/tongue/throat swelling, SOB or lightheadedness with hypotension: Yes Has patient had a PCN reaction causing severe rash involving mucus membranes or skin necrosis: No Has patient had a PCN reaction that required hospitalization: No Has patient had a PCN reaction occurring within the last 10 years: No If all of the above answers are "NO", then may proceed with Cephalosporin use.   . Sulfa Antibiotics Rash   REVIEW OF SYSTEMS:  CONSTITUTIONAL: No fever, fatigue or weakness.  EYES: No blurred or double vision.  EARS, NOSE, AND THROAT: No tinnitus or ear pain.  RESPIRATORY: No cough, shortness of breath, wheezing or hemoptysis.  CARDIOVASCULAR: No chest pain, orthopnea, edema.  GASTROINTESTINAL: No nausea, vomiting, diarrhea or abdominal pain.  GENITOURINARY: No dysuria, hematuria.  ENDOCRINE: No polyuria, nocturia,  HEMATOLOGY: No anemia, easy bruising or bleeding SKIN: No rash or lesion. MUSCULOSKELETAL: Lt Knee pain  NEUROLOGIC: No tingling, numbness, weakness.  PSYCHIATRY: No anxiety or depression.  MEDICATIONS AT HOME:   Prior to Admission medications   Medication Sig Start Date End Date  Taking? Authorizing Provider  acetaminophen (TYLENOL) 325 MG tablet Take 650 mg every 6 (six) hours as needed by mouth for mild pain.   Yes [provider]  aspirin EC 81 MG tablet Take 81 mg 2 (two) times daily by mouth.    Yes [provider]  fluticasone (FLONASE) 50 MCG/ACT nasal spray Place 2 sprays into both nostrils daily. Patient taking differently: Place 1 spray daily into both nostrils.  07/10/16 07/10/17 Yes Cook, Jayce G, DO  furosemide (LASIX) 20 MG tablet Take 1  tablet (20 mg total) by mouth daily. 07/10/16  Yes Cook, Jayce G, DO  levocetirizine (XYZAL) 5 MG tablet Take 1 tablet (5 mg total) by mouth every evening. Patient taking differently: Take 5 mg daily as needed by mouth for allergies.  08/28/16  Yes Coral Spikes, DO  levothyroxine (SYNTHROID, LEVOTHROID) 125 MCG tablet Take 1 tablet (125 mcg total) by mouth daily with breakfast. 06/23/16  Yes Thersa Salt G, DO  metoprolol succinate (TOPROL-XL) 25 MG 24 hr tablet Take 25 mg daily by mouth. 03/17/17  Yes [provider]  montelukast (SINGULAIR) 10 MG tablet Take 1 tablet (10 mg total) by mouth every morning. 07/10/16  Yes Cook, Jayce G, DO  pantoprazole (PROTONIX) 40 MG tablet Take 1 tablet (40 mg total) by mouth daily. Patient taking differently: Take 40 mg 2 (two) times daily by mouth.  06/18/16  Yes Cook, Jayce G, DO  potassium chloride (K-DUR) 10 MEQ tablet Take 2 tablets (20 mEq total) by mouth daily. Patient taking differently: Take 30 mEq daily by mouth.  07/10/16  Yes Cook, Jayce G, DO  rOPINIRole (REQUIP) 0.5 MG tablet Take 0.5 mg 2 (two) times daily by mouth. Take one tablet in the afternoon and two tablets at bedtime 02/16/17  Yes [provider]  enoxaparin (LOVENOX) 40 MG/0.4ML injection Inject 0.4 mLs (40 mg total) into the skin daily. 04/16/17   Lattie Corns, PA-C  ondansetron (ZOFRAN) 4 MG tablet Take 1 tablet (4 mg total) by mouth every 6 (six) hours as needed for nausea. 04/16/17   Lattie Corns, PA-C  oxyCODONE (OXY IR/ROXICODONE) 5 MG immediate release tablet Take 1-2 tablets (5-10 mg total) by mouth every 4 (four) hours as needed for severe pain. 04/16/17   Lattie Corns, PA-C   VITAL SIGNS:  Blood pressure 135/63, pulse 82, temperature 98.5 F (36.9 C), temperature source Oral, resp. rate 18, height 5' 5.5" (1.664 m), weight 83.5 kg (184 lb), SpO2 92 %. PHYSICAL EXAMINATION:  GENERAL:  82 y.o.-year-old patient lying in the bed with no acute  distress.  EYES: Pupils equal, round, reactive to light and accommodation. No scleral icterus. Extraocular muscles intact.  HEENT: Head atraumatic, normocephalic. Oropharynx and nasopharynx clear.  NECK:  Supple, no jugular venous distention. No thyroid enlargement, no tenderness.  LUNGS: Normal breath sounds bilaterally, no wheezing, rales,rhonchi or crepitation. No use of accessory muscles of respiration.  CARDIOVASCULAR: S1, S2 normal. No murmurs, rubs, or gallops.  ABDOMEN: Soft, nontender, nondistended. Bowel sounds present. No organomegaly or mass.  EXTREMITIES: Lt Knee dressing in place NEUROLOGIC: Cranial nerves II through XII are intact. Muscle strength 5/5 in all extremities. Sensation intact. Gait not checked.  PSYCHIATRIC: The patient is alert and oriented x 3.  SKIN: No obvious rash, lesion, or ulcer.  LABORATORY PANEL:   CBC Recent Labs  Lab 04/16/17 0301  WBC 4.8  HGB 9.1*  HCT 26.8*  PLT 174   ------------------------------------------------------------------------------------------------------------------  Chemistries  Recent Labs  Lab 04/16/17 0301 04/16/17 1334  NA 125* 122*  K 3.6  --   CL 93*  --   CO2 23  --   GLUCOSE 116*  --   BUN 9  --   CREATININE 0.55  --   CALCIUM 8.4*  --    ------------------------------------------------------------------------------------------------------------------  Cardiac Enzymes No results for input(s): TROPONINI in the last 168 hours. ------------------------------------------------------------------------------------------------------------------  RADIOLOGY:  Dg Knee Left Port  Result Date: 04/14/2017 CLINICAL DATA:  Status post left knee joint replacement. EXAM: PORTABLE LEFT KNEE - 1-2 VIEW COMPARISON:  MRI of the left knee dated February 25, 2017. FINDINGS: The patient has undergone left total knee joint prosthesis placement. Radiographic positioning of the prosthetic components is good. The native bone exhibits  no acute abnormality. Orthopedic drain lines and skin staples are present. IMPRESSION: No immediate postprocedure complication following left total knee joint prosthesis placement. Electronically Signed   By: David  Martinique M.D.   On: 04/14/2017 16:23   EKG:   Orders placed or performed during the hospital encounter of 04/07/17  . EKG 12 lead  . EKG 12 lead   IMPRESSION AND PLAN:  38 y f with admitted for elective Left total knee arthroplasty seen for Acute Hyponatremia  * Acute Hyponatremia: Aysmptomatic - Na dropping from 132->129->125->122 - Likely due to parenteral fluid administration in this postoperative patient (possibly due to ADH hypersecretion associated with surgery) - recommend fluid restriction, stop Lasix - can consider 50 mL bolus of 3 percent saline (ie, hypertonic saline) to prevent the serum sodium from falling further if it drops further - She is positive 2.3 Liters - Monitor Na - Check TSH - Can check Urine lytes if Na continues to drop  * Hypothyroidism - Continue Synthroid - check TSH  * HTN - continue Metoprolol  * Left Knee pain  - s/p Left total knee arthroplasty - mgmt per ortho    All the records are reviewed and case discussed with Consulting provider. Management plans discussed with the patient, friend and they are in agreement.  CODE STATUS: FULL CODE  TOTAL TIME TAKING CARE OF THIS PATIENT: 45 minutes.    Max Sane M.D on 04/16/2017 at 3:04 PM  Between 7am to 6pm - Pager - [phone]  After 6pm go to [email or web address] - Proofreader  [employer] [facility] [place] [facility]  Office  [phone]  CC: Primary care Physician: Ellene Route   Note: This dictation was prepared with Dragon dictation along with smaller phrase technology. Any transcriptional errors that result from this process are unintentional.

## 2017-04-16 NOTE — NC FL2 (Signed)
[place] LEVEL OF CARE SCREENING TOOL     IDENTIFICATION  Patient Name: Deborah Deleon Birthdate: Mar 30, 1935 Sex: female Admission Date (Current Location): 04/14/2017  [place] and [place] Number:  Engineering geologist and Address:  [facility], [address] [place], [place] [address]      Provider Number: [id number]  Attending Physician Name and Address:  Dereck Leep, MD  Relative Name and Phone Number:       Current Level of Care: Hospital Recommended Level of Care: [place] Prior Approval Number:    Date Approved/Denied:   PASRR Number: [id number])  Discharge Plan: SNF    Current Diagnoses: Patient Active Problem List   Diagnosis Date Noted  . S/P total knee arthroplasty 04/14/2017  . Primary osteoarthritis of left knee 04/04/2017  . OSA on CPAP 03/22/2017  . Other and unspecified hyperlipidemia 02/18/2017  . Venous stasis 07/10/2016  . CAD (coronary artery disease) 06/18/2016  . Aortic insufficiency 06/18/2016  . Mitral regurgitation 06/18/2016  . Essential hypertension 12/22/2015  . Collagenous colitis 12/22/2015  . Celiac disease 12/22/2015  . Hypothyroidism 01/09/2015  . Restless leg syndrome 01/09/2015  . Pulmonary nodule 11/27/2013  . GERD (gastroesophageal reflux disease) 11/13/2013    Orientation RESPIRATION BLADDER Height & Weight     Self, Time, Situation, Place  Normal Continent Weight: 184 lb (83.5 kg) Height:  5' 5.5" (166.4 cm)  BEHAVIORAL SYMPTOMS/MOOD NEUROLOGICAL BOWEL NUTRITION STATUS      Continent Diet(Regular Diet )  AMBULATORY STATUS COMMUNICATION OF NEEDS Skin   Extensive Assist Verbally Surgical wounds(Incision: Left Knee. )                       Personal Care Assistance Level of Assistance  Bathing, Feeding, Dressing Bathing Assistance: Limited assistance Feeding assistance: Independent Dressing Assistance: Limited assistance     Functional Limitations  Info  Sight, Hearing, Speech Sight Info: Adequate Hearing Info: Adequate Speech Info: Adequate    SPECIAL CARE FACTORS FREQUENCY  PT (By licensed PT), OT (By licensed OT)     PT Frequency: (5) OT Frequency: (5)            Contractures      Additional Factors Info  Code Status, Allergies Code Status Info: (Full Code. ) Allergies Info: (Penicillins, Sulfa Antibiotics)           Current Medications (04/16/2017):  This is the current hospital active medication list Current Facility-Administered Medications  Medication Dose Route Frequency Provider Last Rate Last Dose  . 0.9 %  sodium chloride infusion   Intravenous Continuous Hooten, Laurice Record, MD 100 mL/hr at 04/15/17 4268    . acetaminophen (TYLENOL) tablet 650 mg  650 mg Oral Q4H PRN Hooten, Laurice Record, MD       Or  . acetaminophen (TYLENOL) suppository 650 mg  650 mg Rectal Q4H PRN Hooten, Laurice Record, MD      . alum & mag hydroxide-simeth (MAALOX/MYLANTA) 200-200-20 MG/5ML suspension 30 mL  30 mL Oral Q4H PRN Hooten, Laurice Record, MD      . bisacodyl (DULCOLAX) suppository 10 mg  10 mg Rectal Daily PRN Hooten, Laurice Record, MD      . celecoxib (CELEBREX) capsule 200 mg  200 mg Oral Q12H Hooten, Laurice Record, MD   200 mg at 04/15/17 2121  . diphenhydrAMINE (BENADRYL) 12.5 MG/5ML elixir 12.5-25 mg  12.5-25 mg Oral Q4H PRN Hooten, Laurice Record, MD      .  enoxaparin (LOVENOX) injection 30 mg  30 mg Subcutaneous Q12H Hooten, Laurice Record, MD   30 mg at 04/16/17 0826  . ferrous sulfate tablet 325 mg  325 mg Oral BID WC Hooten, Laurice Record, MD   325 mg at 04/16/17 0825  . fluticasone (FLONASE) 50 MCG/ACT nasal spray 1 spray  1 spray Each Nare Daily Hooten, Laurice Record, MD   1 spray at 04/15/17 0941  . furosemide (LASIX) tablet 20 mg  20 mg Oral Daily Hooten, Laurice Record, MD      . levothyroxine (SYNTHROID, LEVOTHROID) tablet 125 mcg  125 mcg Oral Q breakfast Dereck Leep, MD   125 mcg at 04/16/17 0825  . loratadine (CLARITIN) tablet 10 mg  10 mg Oral Daily PRN Hooten,  Laurice Record, MD      . magnesium hydroxide (MILK OF MAGNESIA) suspension 30 mL  30 mL Oral Daily PRN Hooten, Laurice Record, MD      . menthol-cetylpyridinium (CEPACOL) lozenge 3 mg  1 lozenge Oral PRN Hooten, Laurice Record, MD       Or  . phenol (CHLORASEPTIC) mouth spray 1 spray  1 spray Mouth/Throat PRN Hooten, Laurice Record, MD      . metoCLOPramide (REGLAN) tablet 10 mg  10 mg Oral TID AC & HS Hooten, Laurice Record, MD   10 mg at 04/16/17 0825  . metoprolol succinate (TOPROL-XL) 24 hr tablet 25 mg  25 mg Oral Daily Hooten, Laurice Record, MD   25 mg at 04/15/17 0934  . montelukast (SINGULAIR) tablet 10 mg  10 mg Oral Roderic Palau, MD   10 mg at 04/16/17 0825  . morphine 2 MG/ML injection 2 mg  2 mg Intravenous Q2H PRN Hooten, Laurice Record, MD      . ondansetron (ZOFRAN) tablet 4 mg  4 mg Oral Q6H PRN Hooten, Laurice Record, MD       Or  . ondansetron (ZOFRAN) injection 4 mg  4 mg Intravenous Q6H PRN Hooten, Laurice Record, MD   4 mg at 04/15/17 1344  . oxyCODONE (Oxy IR/ROXICODONE) immediate release tablet 10 mg  10 mg Oral Q3H PRN Hooten, Laurice Record, MD      . oxyCODONE (Oxy IR/ROXICODONE) immediate release tablet 5 mg  5 mg Oral Q3H PRN Hooten, Laurice Record, MD   5 mg at 04/16/17 0549  . pantoprazole (PROTONIX) EC tablet 40 mg  40 mg Oral BID Dereck Leep, MD   40 mg at 04/15/17 2121  . potassium chloride (K-DUR) CR tablet 30 mEq  30 mEq Oral Daily Hooten, Laurice Record, MD      . rOPINIRole (REQUIP) tablet 0.5 mg  0.5 mg Oral Daily Hooten, Laurice Record, MD   0.5 mg at 04/15/17 0935  . rOPINIRole (REQUIP) tablet 1 mg  1 mg Oral QHS Hooten, Laurice Record, MD   1 mg at 04/15/17 2121  . senna-docusate (Senokot-S) tablet 1 tablet  1 tablet Oral BID Dereck Leep, MD   1 tablet at 04/15/17 2121  . sodium phosphate (FLEET) 7-19 GM/118ML enema 1 enema  1 enema Rectal Once PRN Hooten, Laurice Record, MD      . traMADol Veatrice Bourbon) tablet 50-100 mg  50-100 mg Oral Q4H PRN Dereck Leep, MD   50 mg at 04/16/17 5093  . zolpidem (AMBIEN) tablet 5 mg  5 mg Oral QHS PRN  Hooten, Laurice Record, MD         Discharge Medications: Please see discharge summary for a  list of discharge medications.  Relevant Imaging Results:  Relevant Lab Results:   Additional Information (SSN: [id number])  Tyshia Fenter, Veronia Beets, LCSW

## 2017-04-17 LAB — BASIC METABOLIC PANEL
Anion gap: 6 (ref 5–15)
BUN: 7 mg/dL (ref 6–20)
CALCIUM: 8.6 mg/dL — AB (ref 8.9–10.3)
CHLORIDE: 88 mmol/L — AB (ref 101–111)
CO2: 28 mmol/L (ref 22–32)
CREATININE: 0.47 mg/dL (ref 0.44–1.00)
GFR calc non Af Amer: >60 mL/min (ref >60)
Glucose, Bld: 108 mg/dL — ABNORMAL HIGH (ref 65–99)
Potassium: 4.1 mmol/L (ref 3.5–5.1)
SODIUM: 122 mmol/L — AB (ref 135–145)

## 2017-04-17 LAB — CBC
HCT: 26.6 % — ABNORMAL LOW (ref 35.0–47.0)
Hemoglobin: 9.2 g/dL — ABNORMAL LOW (ref 12.0–16.0)
MCH: 29.6 pg (ref 26.0–34.0)
MCHC: 34.5 g/dL (ref 32.0–36.0)
MCV: 85.9 fL (ref 80.0–100.0)
Platelets: 181 10*3/uL (ref 150–440)
RBC: 3.1 MIL/uL — ABNORMAL LOW (ref 3.80–5.20)
RDW: 14.6 % — AB (ref 11.5–14.5)
WBC: 4.1 10*3/uL (ref 3.6–11.0)

## 2017-04-17 LAB — SODIUM: SODIUM: 121 mmol/L — AB (ref 135–145)

## 2017-04-17 MED ORDER — SODIUM BICARBONATE 650 MG PO TABS
650.0000 mg | ORAL_TABLET | Freq: Two times a day (BID) | ORAL | Status: DC
  Administered 2017-04-17 (×2): 650 mg via ORAL
  Filled 2017-04-17 (×2): qty 1

## 2017-04-17 NOTE — Progress Notes (Signed)
Pts Na 121, Cameron Proud notified. No new orders at this time.

## 2017-04-17 NOTE — Progress Notes (Signed)
Physical Therapy Treatment Patient Details Name: Deborah Deleon MRN: [id number] DOB: 30-Jul-1934 Today's Date: 04/17/2017    History of Present Illness Patient is a pleasant 82 y/o female that presents after L TKR, on 04/14/2017.     PT Comments    Pt agreeable to PT; reports 2/10 pain left knee. Pt Na low this date (122); will have re check later today. Deferred strenuous activity. Pt without adverse symptoms. Pt participates in Left lower extremity exercises/stretching and functional activity up/out of bed to the chair with less assist for transfers today. Pt encouraged continued work on quad sets in chair to continue to improve left knee extension. Plan to see pt this afternoon per lab results/discharge status.    Follow Up Recommendations  SNF     Equipment Recommendations  Rolling walker with 5" wheels    Recommendations for Other Services       Precautions / Restrictions Precautions Precautions: Fall Restrictions Weight Bearing Restrictions: Yes LLE Weight Bearing: Weight bearing as tolerated    Mobility  Bed Mobility Overal bed mobility: Modified Independent Bed Mobility: Supine to Sit     Supine to sit: Modified independent (Device/Increase time)        Transfers Overall transfer level: Needs assistance Equipment used: Rolling walker (2 wheeled) Transfers: Sit to/from Stand Sit to Stand: Min guard            Ambulation/Gait Ambulation/Gait assistance: Min guard Ambulation Distance (Feet): 3 Feet Assistive device: Rolling walker (2 wheeled) Gait Pattern/deviations: Step-to pattern     General Gait Details: only bed to chair   Stairs            Wheelchair Mobility    Modified Rankin (Stroke Patients Only)       Balance Overall balance assessment: Needs assistance Sitting-balance support: No upper extremity supported;Feet supported Sitting balance-Leahy Scale: Good     Standing balance support: Bilateral upper extremity  supported Standing balance-Leahy Scale: Fair                              Cognition Arousal/Alertness: Awake/alert Behavior During Therapy: WFL for tasks assessed/performed Overall Cognitive Status: Within Functional Limits for tasks assessed                                 General Comments: Pt with low Na today (122); rechecking this afternoon to determine discharge status0      Exercises Total Joint Exercises Ankle Circles/Pumps: AROM;Both;20 reps Quad Sets: Strengthening;Both;20 reps(several in stand ) Long Arc Quad: AAROM;AROM;Left;20 reps Knee Flexion: AROM;Left;10 reps;Seated(3 positions each rep) Goniometric ROM: 2-82    General Comments General comments (skin integrity, edema, etc.): swelling into calf/demarkation at sock line      Pertinent Vitals/Pain Pain Assessment: 0-10 Pain Score: 2  Pain Location: L knee     Home Living                      Prior Function            PT Goals (current goals can now be found in the care plan section) Progress towards PT goals: Progressing toward goals    Frequency    BID      PT Plan Current plan remains appropriate    Co-evaluation              AM-PAC PT "6 Clicks" Daily  Activity  Outcome Measure  Difficulty turning over in bed (including adjusting bedclothes, sheets and blankets)?: A Little Difficulty moving from lying on back to sitting on the side of the bed? : A Little Difficulty sitting down on and standing up from a chair with arms (e.g., wheelchair, bedside commode, etc,.)?: Unable Help needed moving to and from a bed to chair (including a wheelchair)?: A Little Help needed walking in hospital room?: A Little Help needed climbing 3-5 steps with a railing? : A Lot 6 Click Score: 15    End of Session Equipment Utilized During Treatment: Gait belt Activity Tolerance: Patient tolerated treatment well Patient left: in chair;with call bell/phone within reach;with  chair alarm set;with family/visitor present   PT Visit Diagnosis: Muscle weakness (generalized) (M62.81);Pain;Difficulty in walking, not elsewhere classified (R26.2) Pain - Right/Left: Left Pain - part of body: Knee     Time: 4540-9811 PT Time Calculation (min) (ACUTE ONLY): 26 min  Charges:  $Therapeutic Exercise: 8-22 mins $Therapeutic Activity: 8-22 mins                    G CodesLarae Grooms, PTA 04/17/2017, 10:39 AM

## 2017-04-17 NOTE — Progress Notes (Signed)
Physical Therapy Treatment Patient Details Name: Deborah Deleon MRN: [id number] DOB: 28-Feb-1935 Today's Date: 04/17/2017    History of Present Illness Patient is a pleasant 82 y/o female that presents after L TKR, on 04/14/2017.     PT Comments    Pt agreeable to PT; notes fatigue. Pt demonstrating ambulation to/from bathroom, 26 ft x 2 with cues for QS with left weight bearing. Pt also requires cues for increased use of Left lower extremity with STS transfers. Re educated in left knee stretching. Pt wishes return back to bed post session. Continue PT to progress range, strength, and endurance to improve all functional mobility.    Follow Up Recommendations  SNF     Equipment Recommendations  Rolling walker with 5" wheels    Recommendations for Other Services       Precautions / Restrictions Precautions Precautions: Fall Restrictions Weight Bearing Restrictions: Yes LLE Weight Bearing: Weight bearing as tolerated    Mobility  Bed Mobility Overal bed mobility: Modified Independent Bed Mobility: Supine to Sit     Supine to sit: Modified independent (Device/Increase time) Sit to supine: Modified independent (Device/Increase time)      Transfers Overall transfer level: Needs assistance Equipment used: Rolling walker (2 wheeled) Transfers: Sit to/from Stand Sit to Stand: Min guard         General transfer comment: Encouraged improved use of LLE  Ambulation/Gait Ambulation/Gait assistance: Min guard Ambulation Distance (Feet): 25 Feet(performed twice) Assistive device: Rolling walker (2 wheeled) Gait Pattern/deviations: Step-to pattern   Gait velocity interpretation: Below normal speed for age/gender General Gait Details: only bed to chair   Stairs            Wheelchair Mobility    Modified Rankin (Stroke Patients Only)       Balance Overall balance assessment: Needs assistance Sitting-balance support: No upper extremity supported;Feet  supported Sitting balance-Leahy Scale: Good     Standing balance support: Bilateral upper extremity supported Standing balance-Leahy Scale: Fair                              Cognition Arousal/Alertness: Awake/alert Behavior During Therapy: WFL for tasks assessed/performed Overall Cognitive Status: Within Functional Limits for tasks assessed                                 General Comments: Pt with low Na today (122); rechecking this afternoon to determine discharge status0      Exercises Total Joint Exercises Ankle Circles/Pumps: AROM;Both;20 reps Quad Sets: Strengthening;Both;20 reps(several in stand ) Long Arc Quad: AAROM;AROM;Left;20 reps Knee Flexion: AROM;Left;10 reps;Seated(3 positions each rep) Goniometric ROM: 2-82 Other Exercises Other Exercises: set up for toileting/hand hygiene     General Comments General comments (skin integrity, edema, etc.): swelling into calf/demarkation at sock line      Pertinent Vitals/Pain Pain Assessment: 0-10 Pain Score: 7 (with wb'g ambulation) Pain Location: L knee  Pain Intervention(s): Monitored during session;Limited activity within patient's tolerance    Home Living                      Prior Function            PT Goals (current goals can now be found in the care plan section) Progress towards PT goals: Progressing toward goals    Frequency    BID      PT  Plan Current plan remains appropriate    Co-evaluation              AM-PAC PT "6 Clicks" Daily Activity  Outcome Measure  Difficulty turning over in bed (including adjusting bedclothes, sheets and blankets)?: A Little Difficulty moving from lying on back to sitting on the side of the bed? : A Little Difficulty sitting down on and standing up from a chair with arms (e.g., wheelchair, bedside commode, etc,.)?: Unable Help needed moving to and from a bed to chair (including a wheelchair)?: A Little Help needed walking  in hospital room?: A Little Help needed climbing 3-5 steps with a railing? : A Lot 6 Click Score: 15    End of Session Equipment Utilized During Treatment: Gait belt Activity Tolerance: Patient tolerated treatment well Patient left: in bed;with bed alarm set;with call bell/phone within reach;with SCD's reapplied;Other (comment)(polar care in place)   PT Visit Diagnosis: Muscle weakness (generalized) (M62.81);Pain;Difficulty in walking, not elsewhere classified (R26.2) Pain - Right/Left: Left Pain - part of body: Knee     Time: 1345-1413 PT Time Calculation (min) (ACUTE ONLY): 28 min  Charges:  $Gait Training: 8-22 mins $Therapeutic Exercise: 8-22 mins $Therapeutic Activity: 8-22 mins                    G Codes:        Larae Grooms, PTA 04/17/2017, 2:18 PM

## 2017-04-17 NOTE — Progress Notes (Signed)
  Subjective: 3 Days Post-Op Procedure(s) (LRB): COMPUTER ASSISTED TOTAL KNEE ARTHROPLASTY (Left) Patient reports pain as mild.   Patient is well, but continues to have hyponatremia.  Asymptomatic Plan at this time is for discharge to SNF when medically appropriate. Negative for chest pain and shortness of breath Fever: no Gastrointestinal:Negative for nausea and vomiting  Objective: Vital signs in last 24 hours: Temp:  [97.5 F (36.4 C)-98.5 F (36.9 C)] 98.4 F (36.9 C) (11/24 0728) Pulse Rate:  [81-89] 89 (11/24 0728) Resp:  [16-18] 18 (11/24 0728) BP: (135-164)/(63-78) 147/68 (11/24 0728) SpO2:  [92 %-95 %] 93 % (11/24 0728)  Intake/Output from previous day:  Intake/Output Summary (Last 24 hours) at 04/17/2017 0738 Last data filed at 04/16/2017 1300 Gross per 24 hour  Intake 840 ml  Output 150 ml  Net 690 ml    Intake/Output this shift: No intake/output data recorded.  Labs: Recent Labs    04/16/17 0301 04/17/17 0332  HGB 9.1* 9.2*   Recent Labs    04/16/17 0301 04/17/17 0332  WBC 4.8 4.1  RBC 3.09* 3.10*  HCT 26.8* 26.6*  PLT 174 181   Recent Labs    04/16/17 0301 04/16/17 1334 04/17/17 0332  NA 125* 122* 122*  K 3.6  --  4.1  CL 93*  --  88*  CO2 23  --  28  BUN 9  --  7  CREATININE 0.55  --  0.47  GLUCOSE 116*  --  108*  CALCIUM 8.4*  --  8.6*   No results for input(s): LABPT, INR in the last 72 hours.   EXAM General - Patient is Alert, Appropriate and Oriented Extremity - ABD soft Sensation intact distally Intact pulses distally Incision: dressing C/D/I No cellulitis present Dressing/Incision - clean, dry, no drainage.  No erythema or signs of infection Motor Function - intact, moving foot and toes well on exam.  Abdomen soft with normal BS.  Past Medical History:  Diagnosis Date  . Allergy   . Cataract cortical, senile   . Celiac disease   . Chicken pox   . Collagenous colitis   . Degenerative joint disease   .  Diverticulosis   . Family history of adverse reaction to anesthesia    daughter climbs the walls with anesthesia  . GERD (gastroesophageal reflux disease)   . Hypertension   . Hypothyroidism   . Restless leg     Assessment/Plan: 3 Days Post-Op Procedure(s) (LRB): COMPUTER ASSISTED TOTAL KNEE ARTHROPLASTY (Left) Active Problems:   S/P total knee arthroplasty  Estimated body mass index is 30.15 kg/m as calculated from the following:   Height as of this encounter: 5' 5.5" (1.664 m).   Weight as of this encounter: 83.5 kg (184 lb). Advance diet Up with therapy   Labs reviewed, Na 122 following yesterday.  Continue fluid restrictions today.  Internal medicine consulted yesterday for hyponatremia. K+ 4.1, continue to monitor.  May need to add klor-con. Up with PT today, patient has had a BM. Discussed current care with Dr. Marry Guan, will re-check Na around noon today, if still asymptomatic and doing well will discharge to [facility] with fluid restriction guidelines.  DVT Prophylaxis - Lovenox, Foot Pumps and TED hose Weight-Bearing as tolerated to left leg  J. Cameron Proud, PA-C [facility] Orthopaedic Surgery 04/17/2017, 7:38 AM

## 2017-04-17 NOTE — Progress Notes (Signed)
[facility] [place] NAME: Deborah Deleon    MR#:  [id number]  DATE OF BIRTH:  01/01/1935  SUBJECTIVE:  CHIEF COMPLAINT: Patient is resting comfortably denies any complaints.  Answering questions appropriately  REVIEW OF SYSTEMS:  CONSTITUTIONAL: No fever, fatigue or weakness.  EYES: No blurred or double vision.  EARS, NOSE, AND THROAT: No tinnitus or ear pain.  RESPIRATORY: No cough, shortness of breath, wheezing or hemoptysis.  CARDIOVASCULAR: No chest pain, orthopnea, edema.  GASTROINTESTINAL: No nausea, vomiting, diarrhea or abdominal pain.  GENITOURINARY: No dysuria, hematuria.  ENDOCRINE: No polyuria, nocturia,  HEMATOLOGY: No anemia, easy bruising or bleeding SKIN: No rash or lesion. MUSCULOSKELETAL: Right knee surgery no joint pain or arthritis.   NEUROLOGIC: No tingling, numbness, weakness.  PSYCHIATRY: No anxiety or depression.   DRUG ALLERGIES:   Allergies  Allergen Reactions  . Penicillins Swelling    Has patient had a PCN reaction causing immediate rash, facial/tongue/throat swelling, SOB or lightheadedness with hypotension: Yes Has patient had a PCN reaction causing severe rash involving mucus membranes or skin necrosis: No Has patient had a PCN reaction that required hospitalization: No Has patient had a PCN reaction occurring within the last 10 years: No If all of the above answers are "NO", then may proceed with Cephalosporin use.   . Sulfa Antibiotics Rash    VITALS:  Blood pressure (!) 147/68, pulse 88, temperature 98.4 F (36.9 C), temperature source Oral, resp. rate 18, height 5' 5.5" (1.664 m), weight 83.5 kg (184 lb), SpO2 94 %.  PHYSICAL EXAMINATION:  GENERAL:  82 y.o.-year-old patient lying in the bed with no acute distress.  EYES: Pupils equal, round, reactive to light and accommodation. No scleral icterus. Extraocular muscles intact.  HEENT: Head atraumatic, normocephalic. Oropharynx and  nasopharynx clear.  NECK:  Supple, no jugular venous distention. No thyroid enlargement, no tenderness.  LUNGS: Normal breath sounds bilaterally, no wheezing, rales,rhonchi or crepitation. No use of accessory muscles of respiration.  CARDIOVASCULAR: S1, S2 normal. No murmurs, rubs, or gallops.  ABDOMEN: Soft, nontender, nondistended. Bowel sounds present. No organomegaly or mass.  EXTREMITIES: Right knee status post arthroplasty no pedal edema, cyanosis, or clubbing.  NEUROLOGIC: Cranial nerves II through XII are intact. Muscle strength 5/5 in all extremities  except right lower extremity Sensation intact. Gait not checked.  PSYCHIATRIC: The patient is alert and oriented x 3.  SKIN: No obvious rash, lesion, or ulcer.    LABORATORY PANEL:   CBC Recent Labs  Lab 04/17/17 0332  WBC 4.1  HGB 9.2*  HCT 26.6*  PLT 181   ------------------------------------------------------------------------------------------------------------------  Chemistries  Recent Labs  Lab 04/17/17 0332  NA 122*  K 4.1  CL 88*  CO2 28  GLUCOSE 108*  BUN 7  CREATININE 0.47  CALCIUM 8.6*   ------------------------------------------------------------------------------------------------------------------  Cardiac Enzymes No results for input(s): TROPONINI in the last 168 hours. ------------------------------------------------------------------------------------------------------------------  RADIOLOGY:  No results found.  EKG:   Orders placed or performed during the hospital encounter of 04/07/17  . EKG 12 lead  . EKG 12 lead    ASSESSMENT AND PLAN:    82 y f with admitted for elective Lefttotal knee arthroplasty seen for Acute Hyponatremia  * Acute Hyponatremia: Aysmptomatic, could be from continuous IV fluids and SIADH-. -Patient is asymptomatic and mentating fine - Na dropping from 132->129->125->122 -- recommending to continue fluid restriction, stop Lasix -Patient is started on sodium  bicarb tablets by mouth --If patient gets discharged  to SNF today PCP at the facility to monitor sodium in 2 days on Monday -Normal TSH - Can check Urine lytes if Na continues to drop  * Hypothyroidism - Continue Synthroid -Normal TSH  * HTN - continue Metoprolol  * Left Knee pain  - s/p Lefttotal knee arthroplasty - mgmt per ortho       All the records are reviewed and case discussed with Care Management/Social Workerr. Management plans discussed with the patient, family and they are in agreement.    TOTAL TIME TAKING CARE OF THIS PATIENT: 36  minutes.   .  Note: This dictation was prepared with Dragon dictation along with smaller phrase technology. Any transcriptional errors that result from this process are unintentional.   Nicholes Mango M.D on 04/17/2017 at 12:38 PM  Between 7am to 6pm - Pager - [phone] After 6pm go to [email or web address] - password [email or web address] [facility] Hospital[facility]  Office  [phone]  CC: Primary care physician; Ellene Route

## 2017-04-18 DIAGNOSIS — Z96652 Presence of left artificial knee joint: Secondary | ICD-10-CM | POA: Diagnosis not present

## 2017-04-18 DIAGNOSIS — K5900 Constipation, unspecified: Secondary | ICD-10-CM | POA: Diagnosis not present

## 2017-04-18 DIAGNOSIS — I352 Nonrheumatic aortic (valve) stenosis with insufficiency: Secondary | ICD-10-CM | POA: Diagnosis not present

## 2017-04-18 DIAGNOSIS — E871 Hypo-osmolality and hyponatremia: Secondary | ICD-10-CM | POA: Diagnosis not present

## 2017-04-18 DIAGNOSIS — K219 Gastro-esophageal reflux disease without esophagitis: Secondary | ICD-10-CM | POA: Diagnosis not present

## 2017-04-18 DIAGNOSIS — E785 Hyperlipidemia, unspecified: Secondary | ICD-10-CM | POA: Diagnosis not present

## 2017-04-18 DIAGNOSIS — M25569 Pain in unspecified knee: Secondary | ICD-10-CM | POA: Diagnosis not present

## 2017-04-18 DIAGNOSIS — R110 Nausea: Secondary | ICD-10-CM | POA: Diagnosis not present

## 2017-04-18 DIAGNOSIS — I739 Peripheral vascular disease, unspecified: Secondary | ICD-10-CM | POA: Diagnosis not present

## 2017-04-18 DIAGNOSIS — E039 Hypothyroidism, unspecified: Secondary | ICD-10-CM | POA: Diagnosis not present

## 2017-04-18 DIAGNOSIS — I10 Essential (primary) hypertension: Secondary | ICD-10-CM | POA: Diagnosis not present

## 2017-04-18 DIAGNOSIS — G2581 Restless legs syndrome: Secondary | ICD-10-CM | POA: Diagnosis not present

## 2017-04-18 DIAGNOSIS — R2689 Other abnormalities of gait and mobility: Secondary | ICD-10-CM | POA: Diagnosis not present

## 2017-04-18 DIAGNOSIS — G4733 Obstructive sleep apnea (adult) (pediatric): Secondary | ICD-10-CM | POA: Diagnosis not present

## 2017-04-18 DIAGNOSIS — M6281 Muscle weakness (generalized): Secondary | ICD-10-CM | POA: Diagnosis not present

## 2017-04-18 DIAGNOSIS — M1712 Unilateral primary osteoarthritis, left knee: Secondary | ICD-10-CM | POA: Diagnosis not present

## 2017-04-18 DIAGNOSIS — K5791 Diverticulosis of intestine, part unspecified, without perforation or abscess with bleeding: Secondary | ICD-10-CM | POA: Diagnosis not present

## 2017-04-18 LAB — BASIC METABOLIC PANEL
ANION GAP: 6 (ref 5–15)
BUN: 12 mg/dL (ref 6–20)
CALCIUM: 8.4 mg/dL — AB (ref 8.9–10.3)
CO2: 29 mmol/L (ref 22–32)
Chloride: 87 mmol/L — ABNORMAL LOW (ref 101–111)
Creatinine, Ser: 0.36 mg/dL — ABNORMAL LOW (ref 0.44–1.00)
GFR calc Af Amer: >60 mL/min (ref >60)
Glucose, Bld: 112 mg/dL — ABNORMAL HIGH (ref 65–99)
POTASSIUM: 3.8 mmol/L (ref 3.5–5.1)
Sodium: 122 mmol/L — ABNORMAL LOW (ref 135–145)

## 2017-04-18 MED ORDER — SODIUM BICARBONATE 650 MG PO TABS: 650.0000 mg | ORAL_TABLET | Freq: Four times a day (QID) | ORAL | 0 refills | Status: DC

## 2017-04-18 MED ORDER — SODIUM BICARBONATE 650 MG PO TABS
650.0000 mg | ORAL_TABLET | Freq: Four times a day (QID) | ORAL | Status: DC
  Administered 2017-04-18: 650 mg via ORAL
  Filled 2017-04-18: qty 1

## 2017-04-18 NOTE — Clinical Social Work Note (Signed)
Patient will discharge to [facility] Room 804 today via non-emergent EMS. The patient is aware and in agreement, and the facility has received all needed documentation. The packet has been delivered to the patient's chart. CSW will continue to follow pending additional discharge needs.

## 2017-04-18 NOTE — Progress Notes (Signed)
Physical Therapy Treatment Patient Details Name: Deborah Deleon MRN: [id number] DOB: 12-01-1934 Today's Date: 04/18/2017    History of Present Illness Patient is a pleasant 82 y/o female that presents after L TKR, on 04/14/2017.     PT Comments    Patient continues to feel somewhat nauseated with standing/ambulation. She is able to better tolerate mobility this date, and is able to ambulate to the hallway before fatiguing and becoming somewhat "sick to her stomach". She has excellent ROM at this point in her rehabilitation (0-88 degrees) and has made excellent progress thus far with mobility. She is still appropriate for SNF placement at discharge, given her fatigue and difficulty ambulating without feeling nauseated.   Follow Up Recommendations  SNF     Equipment Recommendations  Rolling walker with 5" wheels    Recommendations for Other Services       Precautions / Restrictions Precautions Precautions: Fall Restrictions Weight Bearing Restrictions: Yes LLE Weight Bearing: Weight bearing as tolerated    Mobility  Bed Mobility Overal bed mobility: Modified Independent Bed Mobility: Supine to Sit     Supine to sit: Modified independent (Device/Increase time);HOB elevated     General bed mobility comments: Patient required increased time and HOB elevated, otherwise she is able to complete appropriately.   Transfers Overall transfer level: Needs assistance Equipment used: Rolling walker (2 wheeled) Transfers: Sit to/from Stand Sit to Stand: Min guard         General transfer comment: Patient is able to perform transfer swiftly with use of RW appropriately and no loss of balance.   Ambulation/Gait Ambulation/Gait assistance: Min guard Ambulation Distance (Feet): 100 Feet Assistive device: Rolling walker (2 wheeled) Gait Pattern/deviations: Step-to pattern   Gait velocity interpretation: <1.8 ft/sec, indicative of risk for recurrent falls General Gait Details:  Patient is able to ambulate out of room, but fatigues quickly and takes short steps. No buckling observed, though gait speed is decreased from baseline.    Stairs            Wheelchair Mobility    Modified Rankin (Stroke Patients Only)       Balance Overall balance assessment: Needs assistance Sitting-balance support: No upper extremity supported;Feet supported Sitting balance-Leahy Scale: Good     Standing balance support: Bilateral upper extremity supported Standing balance-Leahy Scale: Good                              Cognition Arousal/Alertness: Awake/alert Behavior During Therapy: WFL for tasks assessed/performed Overall Cognitive Status: Within Functional Limits for tasks assessed                                 General Comments: Pt with low Na today (123); rechecking this afternoon to determine discharge status0      Exercises Total Joint Exercises Hip ABduction/ADduction: AROM;15 reps;Both Straight Leg Raises: AROM;Both;15 reps Long Arc Quad: AROM;AAROM;Both;15 reps Knee Flexion: AAROM;Left;15 reps Goniometric ROM: 0-88    General Comments        Pertinent Vitals/Pain Pain Assessment: Faces Faces Pain Scale: Hurts little more Pain Location: L knee  Pain Descriptors / Indicators: Aching;Grimacing;Guarding;Operative site guarding Pain Intervention(s): Limited activity within patient's tolerance;Monitored during session;Repositioned    Home Living                      Prior Function  PT Goals (current goals can now be found in the care plan section) Acute Rehab PT Goals Patient Stated Goal: return to PLOF PT Goal Formulation: With patient Time For Goal Achievement: 04/29/17 Potential to Achieve Goals: Good Progress towards PT goals: Progressing toward goals    Frequency    BID      PT Plan Current plan remains appropriate    Co-evaluation              AM-PAC PT "6 Clicks" Daily  Activity  Outcome Measure  Difficulty turning over in bed (including adjusting bedclothes, sheets and blankets)?: A Little Difficulty moving from lying on back to sitting on the side of the bed? : A Little Difficulty sitting down on and standing up from a chair with arms (e.g., wheelchair, bedside commode, etc,.)?: A Little Help needed moving to and from a bed to chair (including a wheelchair)?: A Little Help needed walking in hospital room?: A Little Help needed climbing 3-5 steps with a railing? : A Lot 6 Click Score: 17    End of Session Equipment Utilized During Treatment: Gait belt Activity Tolerance: Patient tolerated treatment well Patient left: with call bell/phone within reach;in chair;with chair alarm set(polar care in place, in bone foam at heel)   PT Visit Diagnosis: Muscle weakness (generalized) (M62.81);Pain;Difficulty in walking, not elsewhere classified (R26.2) Pain - Right/Left: Left Pain - part of body: Knee     Time: 0488-8916 PT Time Calculation (min) (ACUTE ONLY): 23 min  Charges:  $Gait Training: 8-22 mins $Therapeutic Exercise: 8-22 mins                    G Codes:  Functional Assessment Tool Used: AM-PAC 6 Clicks Basic Mobility Functional Limitation: Mobility: Walking and moving around Mobility: Walking and Moving Around Current Status (X4503): At least 20 percent but less than 40 percent impaired, limited or restricted Mobility: Walking and Moving Around Goal Status [phone]): At least 20 percent but less than 40 percent impaired, limited or restricted   Royce Macadamia PT, DPT, CSCS    04/18/2017, 3:59 PM

## 2017-04-18 NOTE — Progress Notes (Signed)
  Subjective: 4 Days Post-Op Procedure(s) (LRB): COMPUTER ASSISTED TOTAL KNEE ARTHROPLASTY (Left) Patient reports pain as mild.   Patient is well, but continues to have hyponatremia.  Asymptomatic Plan at this time is for discharge to SNF when medically appropriate. Negative for chest pain and shortness of breath Fever: no Gastrointestinal:Negative for nausea and vomiting  Objective: Vital signs in last 24 hours: Temp:  [97.7 F (36.5 C)-98.6 F (37 C)] 98.1 F (36.7 C) (11/25 0729) Pulse Rate:  [72-89] 72 (11/25 0729) Resp:  [18] 18 (11/25 0729) BP: (135-152)/(65-73) 150/72 (11/25 0729) SpO2:  [93 %-96 %] 94 % (11/25 0729)  Intake/Output from previous day:  Intake/Output Summary (Last 24 hours) at 04/18/2017 0801 Last data filed at 04/17/2017 1845 Gross per 24 hour  Intake 360 ml  Output -  Net 360 ml    Intake/Output this shift: No intake/output data recorded.  Labs: Recent Labs    04/16/17 0301 04/17/17 0332  HGB 9.1* 9.2*   Recent Labs    04/16/17 0301 04/17/17 0332  WBC 4.8 4.1  RBC 3.09* 3.10*  HCT 26.8* 26.6*  PLT 174 181   Recent Labs    04/17/17 0332 04/17/17 1436 04/18/17 0336  NA 122* 121* 122*  K 4.1  --  3.8  CL 88*  --  87*  CO2 28  --  29  BUN 7  --  12  CREATININE 0.47  --  0.36*  GLUCOSE 108*  --  112*  CALCIUM 8.6*  --  8.4*   No results for input(s): LABPT, INR in the last 72 hours.   EXAM General - Patient is Alert, Appropriate and Oriented Extremity - ABD soft Sensation intact distally Intact pulses distally Incision: dressing C/D/I No cellulitis present Dressing/Incision - clean, dry, no drainage.  No erythema or signs of infection Motor Function - intact, moving foot and toes well on exam.  Abdomen soft with normal BS.  Past Medical History:  Diagnosis Date  . Allergy   . Cataract cortical, senile   . Celiac disease   . Chicken pox   . Collagenous colitis   . Degenerative joint disease   . Diverticulosis   .  Family history of adverse reaction to anesthesia    daughter climbs the walls with anesthesia  . GERD (gastroesophageal reflux disease)   . Hypertension   . Hypothyroidism   . Restless leg     Assessment/Plan: 4 Days Post-Op Procedure(s) (LRB): COMPUTER ASSISTED TOTAL KNEE ARTHROPLASTY (Left) Active Problems:   S/P total knee arthroplasty  Estimated body mass index is 30.15 kg/m as calculated from the following:   Height as of this encounter: 5' 5.5" (1.664 m).   Weight as of this encounter: 83.5 kg (184 lb). Advance diet Up with therapy   Labs reviewed, Na 122 following yesterday.  Continue fluid restrictions today.  Internal medicine consulted yesterday for hyponatremia and they have placed her on Sodium Bicarb. K+ 3.8, continue to monitor. Up with PT today, patient has had a BM. Plan will be for discharge to Rehab this afternoon, while at rehab, continue to monitor sodium and check sodium on Monday, 04/19/17.  DVT Prophylaxis - Lovenox, Foot Pumps and TED hose Weight-Bearing as tolerated to left leg  J. Cameron Proud, PA-C [facility] 04/18/2017, 8:01 AM

## 2017-04-18 NOTE — Progress Notes (Signed)
Report called to Iva, @ [facility].EMS called for transportation.

## 2017-04-18 NOTE — Progress Notes (Signed)
[place] [facility] [place] NAME: Deborah Deleon    MR#:  [id number]  DATE OF BIRTH:  [birth year]  SUBJECTIVE:  CHIEF COMPLAINT: Patient is resting comfortably denies any complaints.  Answering questions appropriately  REVIEW OF SYSTEMS:  CONSTITUTIONAL: No fever, fatigue or weakness.  EYES: No blurred or double vision.  EARS, NOSE, AND THROAT: No tinnitus or ear pain.  RESPIRATORY: No cough, shortness of breath, wheezing or hemoptysis.  CARDIOVASCULAR: No chest pain, orthopnea, edema.  GASTROINTESTINAL: No nausea, vomiting, diarrhea or abdominal pain.  GENITOURINARY: No dysuria, hematuria.  ENDOCRINE: No polyuria, nocturia,  HEMATOLOGY: No anemia, easy bruising or bleeding SKIN: No rash or lesion. MUSCULOSKELETAL: Right knee surgery no joint pain or arthritis.   NEUROLOGIC: No tingling, numbness, weakness.  PSYCHIATRY: No anxiety or depression.   DRUG ALLERGIES:   Allergies  Allergen Reactions  . Penicillins Swelling    Has patient had a PCN reaction causing immediate rash, facial/tongue/throat swelling, SOB or lightheadedness with hypotension: Yes Has patient had a PCN reaction causing severe rash involving mucus membranes or skin necrosis: No Has patient had a PCN reaction that required hospitalization: No Has patient had a PCN reaction occurring within the last 10 years: No If all of the above answers are "NO", then may proceed with Cephalosporin use.   . Sulfa Antibiotics Rash    VITALS:  Blood pressure (!) 150/72, pulse 72, temperature 98.1 F (36.7 C), temperature source Oral, resp. rate 18, height 5' 5.5" (1.664 m), weight 83.5 kg (184 lb), SpO2 94 %.  PHYSICAL EXAMINATION:  GENERAL:  82 y.o.-year-old patient lying in the bed with no acute distress.  EYES: Pupils equal, round, reactive to light and accommodation. No scleral icterus. Extraocular muscles intact.  HEENT: Head atraumatic, normocephalic. Oropharynx and  nasopharynx clear.  NECK:  Supple, no jugular venous distention. No thyroid enlargement, no tenderness.  LUNGS: Normal breath sounds bilaterally, no wheezing, rales,rhonchi or crepitation. No use of accessory muscles of respiration.  CARDIOVASCULAR: S1, S2 normal. No murmurs, rubs, or gallops.  ABDOMEN: Soft, nontender, nondistended. Bowel sounds present. No organomegaly or mass.  EXTREMITIES: Right knee status post arthroplasty no pedal edema, cyanosis, or clubbing.  NEUROLOGIC: Cranial nerves II through XII are intact. Muscle strength 5/5 in all extremities  except right lower extremity Sensation intact. Gait not checked.  PSYCHIATRIC: The patient is alert and oriented x 3.  SKIN: No obvious rash, lesion, or ulcer.    LABORATORY PANEL:   CBC Recent Labs  Lab 04/17/17 0332  WBC 4.1  HGB 9.2*  HCT 26.6*  PLT 181   ------------------------------------------------------------------------------------------------------------------  Chemistries  Recent Labs  Lab 04/18/17 0336  NA 122*  K 3.8  CL 87*  CO2 29  GLUCOSE 112*  BUN 12  CREATININE 0.36*  CALCIUM 8.4*   ------------------------------------------------------------------------------------------------------------------  Cardiac Enzymes No results for input(s): TROPONINI in the last 168 hours. ------------------------------------------------------------------------------------------------------------------  RADIOLOGY:  No results found.  EKG:   Orders placed or performed during the hospital encounter of 04/07/17  . EKG 12 lead  . EKG 12 lead    ASSESSMENT AND PLAN:    78 y f with admitted for elective Lefttotal knee arthroplasty seen for Acute Hyponatremia  * Acute Hyponatremia: Aysmptomatic, could be from continuous IV fluids and SIADH-. -Patient is asymptomatic and mentating fine - Na dropping from 132->129->125->122-->121-->122 -- recommending to continue fluid restriction, stop Lasix -Patient is on  sodium bicarb tablets by mouth, every 4 hours --If patient  gets discharged to SNF today PCP at the facility to monitor sodium in a.m. on Monday.  If necessary may  consider tolvaptan -Normal TSH - Can check Urine lytes if Na continues to drop  * Hypothyroidism - Continue Synthroid -Normal TSH  * HTN - continue Metoprolol  * Left Knee pain  - s/p Lefttotal knee arthroplasty - mgmt per ortho    Plan of care discussed with orthopedics , they are planning to discharge patient today as she is symptomatic,   All the records are reviewed and case discussed with Care Management/Social Workerr. Management plans discussed with the patient, she is in agreement.    TOTAL TIME TAKING CARE OF THIS PATIENT: 33  minutes.   .  Note: This dictation was prepared with Dragon dictation along with smaller phrase technology. Any transcriptional errors that result from this process are unintentional.   Nicholes Mango M.D on 04/18/2017 at 9:44 AM  Between 7am to 6pm - Pager - [phone] After 6pm go to [email or web address] - password [email or web address] [facility]  [facility]  Office  [phone]  CC: Primary care physician; Ellene Route

## 2017-04-18 NOTE — Discharge Instructions (Signed)
°  Instructions after Total Knee Replacement   Aashritha Miedema P. Holley Bouche., M.D.     Dept. of [facility]  [address] [place], [place]  [address]  Phone: [phone]   Fax: [phone]    DIET:  Drink plenty of non-alcoholic fluids.  Resume your normal diet. Include foods high in fiber.  ACTIVITY:   You may use crutches or a walker with weight-bearing as tolerated, unless instructed otherwise.  You may be weaned off of the walker or crutches by your Physical Therapist.   Do NOT place pillows under the knee. Anything placed under the knee could limit your ability to straighten the knee.    Continue doing gentle exercises. Exercising will reduce the pain and swelling, increase motion, and prevent muscle weakness.    Please continue to use the TED compression stockings for 6 weeks. You may remove the stockings at night, but should reapply them in the morning.  Do not drive or operate any equipment until instructed.  WOUND CARE:   Continue to use the PolarCare or ice packs periodically to reduce pain and swelling.  You may bathe or shower after the staples are removed at the first office visit following surgery.  MEDICATIONS:  You may resume your regular medications.  Please take the pain medication as prescribed on the medication.  Do not take pain medication on an empty stomach.  You have been given a prescription for a blood thinner (Lovenox or Coumadin). Please take the medication as instructed. (NOTE: After completing a 2 week course of Lovenox, take one Enteric-coated aspirin once a day. This along with elevation will help reduce the possibility of phlebitis in your operated leg.)  Do not drive or drink alcoholic beverages when taking pain medications.  CALL THE OFFICE FOR:  Temperature above 101 degrees  Excessive bleeding or drainage on the dressing.  Excessive swelling, coldness, or paleness of the toes.  Persistent  nausea and vomiting.  FOLLOW-UP:   You should have an appointment to return to the office in 10-14 days after surgery.  Arrangements have been made for continuation of Physical Therapy (either home therapy or outpatient therapy).  INSTRUCTIONS FOR REHAB FACILITY: -Check Sodium on 04/19/17, continue to fluid restrict the patient. -Provide sodium supplements if continued hyponatremia. -Hold Lasix while still hyponatremic.

## 2017-04-18 NOTE — Progress Notes (Signed)
EMS here to transport pt. 

## 2017-04-19 ENCOUNTER — Encounter: Payer: Self-pay | Admitting: Orthopedic Surgery

## 2017-04-21 DIAGNOSIS — E039 Hypothyroidism, unspecified: Secondary | ICD-10-CM | POA: Diagnosis not present

## 2017-04-21 DIAGNOSIS — I10 Essential (primary) hypertension: Secondary | ICD-10-CM | POA: Diagnosis not present

## 2017-04-21 DIAGNOSIS — G2581 Restless legs syndrome: Secondary | ICD-10-CM | POA: Diagnosis not present

## 2017-04-21 DIAGNOSIS — M1712 Unilateral primary osteoarthritis, left knee: Secondary | ICD-10-CM | POA: Diagnosis not present

## 2017-04-21 DIAGNOSIS — K5900 Constipation, unspecified: Secondary | ICD-10-CM | POA: Diagnosis not present

## 2017-04-21 DIAGNOSIS — K219 Gastro-esophageal reflux disease without esophagitis: Secondary | ICD-10-CM | POA: Diagnosis not present

## 2017-04-21 DIAGNOSIS — R110 Nausea: Secondary | ICD-10-CM | POA: Diagnosis not present

## 2017-04-21 DIAGNOSIS — Z96652 Presence of left artificial knee joint: Secondary | ICD-10-CM | POA: Diagnosis not present

## 2017-04-26 ENCOUNTER — Other Ambulatory Visit: Payer: Self-pay | Admitting: *Deleted

## 2017-04-26 NOTE — Patient Outreach (Signed)
[facility]) Care Management  04/26/2017  ESTELL PUCCINI [birth year] [id number]   Met with Dimple Nanas, SW at facility, he states patient to be discharge later in week with home care.  States she is doing well, he does not anticipate any issues with discharge.   Met with patient at facility, she reports she is still working but will retire at the end of the month.  She has supportive family and was independent prior to surgery.  She denies any transportation or medication management needs.   RNCM reviewed [facility] and gave brochure for future reference.   Plan to sign off as not [facility] care management needs assessed. Royetta Crochet. Laymond Purser, RN, BSN, [place] [phone]) Business Cell  [phone]) Toll Free Office

## 2017-04-28 DIAGNOSIS — G2581 Restless legs syndrome: Secondary | ICD-10-CM | POA: Diagnosis not present

## 2017-04-28 DIAGNOSIS — K5900 Constipation, unspecified: Secondary | ICD-10-CM | POA: Diagnosis not present

## 2017-04-28 DIAGNOSIS — K219 Gastro-esophageal reflux disease without esophagitis: Secondary | ICD-10-CM | POA: Diagnosis not present

## 2017-04-28 DIAGNOSIS — Z96652 Presence of left artificial knee joint: Secondary | ICD-10-CM | POA: Diagnosis not present

## 2017-04-28 DIAGNOSIS — I10 Essential (primary) hypertension: Secondary | ICD-10-CM | POA: Diagnosis not present

## 2017-04-28 DIAGNOSIS — M1712 Unilateral primary osteoarthritis, left knee: Secondary | ICD-10-CM | POA: Diagnosis not present

## 2017-04-28 DIAGNOSIS — E039 Hypothyroidism, unspecified: Secondary | ICD-10-CM | POA: Diagnosis not present

## 2017-04-30 DIAGNOSIS — I352 Nonrheumatic aortic (valve) stenosis with insufficiency: Secondary | ICD-10-CM | POA: Diagnosis not present

## 2017-04-30 DIAGNOSIS — I10 Essential (primary) hypertension: Secondary | ICD-10-CM | POA: Diagnosis not present

## 2017-04-30 DIAGNOSIS — Z7982 Long term (current) use of aspirin: Secondary | ICD-10-CM | POA: Diagnosis not present

## 2017-04-30 DIAGNOSIS — M1990 Unspecified osteoarthritis, unspecified site: Secondary | ICD-10-CM | POA: Diagnosis not present

## 2017-04-30 DIAGNOSIS — I739 Peripheral vascular disease, unspecified: Secondary | ICD-10-CM | POA: Diagnosis not present

## 2017-04-30 DIAGNOSIS — I2510 Atherosclerotic heart disease of native coronary artery without angina pectoris: Secondary | ICD-10-CM | POA: Diagnosis not present

## 2017-04-30 DIAGNOSIS — Z471 Aftercare following joint replacement surgery: Secondary | ICD-10-CM | POA: Diagnosis not present

## 2017-04-30 DIAGNOSIS — Z7951 Long term (current) use of inhaled steroids: Secondary | ICD-10-CM | POA: Diagnosis not present

## 2017-04-30 DIAGNOSIS — E782 Mixed hyperlipidemia: Secondary | ICD-10-CM | POA: Diagnosis not present

## 2017-04-30 DIAGNOSIS — I872 Venous insufficiency (chronic) (peripheral): Secondary | ICD-10-CM | POA: Diagnosis not present

## 2017-04-30 DIAGNOSIS — K5790 Diverticulosis of intestine, part unspecified, without perforation or abscess without bleeding: Secondary | ICD-10-CM | POA: Diagnosis not present

## 2017-04-30 DIAGNOSIS — Z96652 Presence of left artificial knee joint: Secondary | ICD-10-CM | POA: Diagnosis not present

## 2017-04-30 DIAGNOSIS — G2581 Restless legs syndrome: Secondary | ICD-10-CM | POA: Diagnosis not present

## 2017-04-30 DIAGNOSIS — Z7901 Long term (current) use of anticoagulants: Secondary | ICD-10-CM | POA: Diagnosis not present

## 2017-04-30 DIAGNOSIS — E039 Hypothyroidism, unspecified: Secondary | ICD-10-CM | POA: Diagnosis not present

## 2017-04-30 DIAGNOSIS — G4733 Obstructive sleep apnea (adult) (pediatric): Secondary | ICD-10-CM | POA: Diagnosis not present

## 2017-05-06 DIAGNOSIS — Z96652 Presence of left artificial knee joint: Secondary | ICD-10-CM | POA: Diagnosis not present

## 2017-05-06 DIAGNOSIS — Z09 Encounter for follow-up examination after completed treatment for conditions other than malignant neoplasm: Secondary | ICD-10-CM | POA: Diagnosis not present

## 2017-05-06 DIAGNOSIS — E871 Hypo-osmolality and hyponatremia: Secondary | ICD-10-CM | POA: Diagnosis not present

## 2017-05-13 ENCOUNTER — Other Ambulatory Visit: Payer: Self-pay

## 2017-05-13 NOTE — Patient Outreach (Signed)
[facility]) Care Management  05/13/2017  Deborah Deleon 1935/04/24 [id number]   Transition of care  Referral date: 05/05/17 Referral source: discharged from an inpatient admission from [facility] on 04/29/17. Insurance: Health team advantage Attempt #1  Telephone call to patient regarding transition of care follow up. Unable to reach patient. HIPAA compliant voice message left with call back phone number.    PLAN: RNCM will attempt 2nd telephone outreach within 3 business days.   Quinn Plowman RN,BSN,CCM [facility] Telephonic  [phone]

## 2017-05-14 ENCOUNTER — Other Ambulatory Visit: Payer: Self-pay

## 2017-05-14 NOTE — Patient Outreach (Signed)
[facility] [facility]) Care Management  05/14/2017  TAMIE MINTEER 1934-06-19 [id number]  Transition of care  Referral date: 05/05/17 Referral source: discharged from an inpatient admission from [facility] on 04/29/17. Insurance: Health team advantage Attempt #2  Telephone call to patient regarding transition of care referral. Unable to reach patient. HIPAA compliant voice message left with call back phone number.   PLAN;RNM will attempt 3rd telephone call within 3 business days.   Quinn Plowman RN,BSN,CCM [facility] Telephonic  [phone]

## 2017-05-17 ENCOUNTER — Other Ambulatory Visit: Payer: Self-pay

## 2017-05-17 NOTE — Patient Outreach (Signed)
[facility]) Care Management  05/17/2017  Deborah Deleon [birth year] [id number]  Transition of care  Referral date: 05/05/17 Referral source: discharged from an inpatient admission from [facility] on 04/29/17. Insurance: Health team advantage  Telephone call to patient regarding transition of care referral.  HIPAA verified with patient. Discussed transition of program with patient.  Patient states she does not feel she needs additional follow up calls from [facility].  Patient states, "I am doing very well. My doctor has cleared me to drive short distances." Patient states she has had her follow up visit with her orthopedic, Dr. Marry Guan. Patient states she has two more visits with home health then services will be complete. Patient states she had a follow up visit with her primary MD on 05/06/17.  Patient states she is taking her medications as prescribed and has transportation to her appointments. Patient states her knee is doing very well. Patient states her knee is a little sore.  Patient states she does not have any further needs. Patient states she is aware of which doctors to call if she has concerns or problems. RNCM advised patient to notify MD of any changes in condition prior to scheduled appointment. RNCM provided contact for  24 hour nurse advise line [phone].  RNCM verified patient aware of 911 services for urgent/ emergent needs.  PLAN: RNCM will refer patient to care management assistant to close due to refusal of transition of care follow up. RNCM will notify patients primary MD of closure.  RNCM will send patient [facility] care management brochure  Quinn Plowman RN,BSN,CCM [facility] Telephonic  [phone]

## 2017-05-27 DIAGNOSIS — D131 Benign neoplasm of stomach: Secondary | ICD-10-CM | POA: Diagnosis not present

## 2017-05-27 DIAGNOSIS — K317 Polyp of stomach and duodenum: Secondary | ICD-10-CM | POA: Diagnosis not present

## 2017-05-27 DIAGNOSIS — K3189 Other diseases of stomach and duodenum: Secondary | ICD-10-CM | POA: Diagnosis not present

## 2017-05-27 DIAGNOSIS — R1310 Dysphagia, unspecified: Secondary | ICD-10-CM | POA: Diagnosis not present

## 2017-05-27 DIAGNOSIS — Z79899 Other long term (current) drug therapy: Secondary | ICD-10-CM | POA: Diagnosis not present

## 2017-05-27 DIAGNOSIS — I8500 Esophageal varices without bleeding: Secondary | ICD-10-CM | POA: Diagnosis not present

## 2017-05-27 DIAGNOSIS — K449 Diaphragmatic hernia without obstruction or gangrene: Secondary | ICD-10-CM | POA: Diagnosis not present

## 2017-06-01 DIAGNOSIS — Z96652 Presence of left artificial knee joint: Secondary | ICD-10-CM | POA: Diagnosis not present

## 2017-06-03 DIAGNOSIS — G4733 Obstructive sleep apnea (adult) (pediatric): Secondary | ICD-10-CM | POA: Diagnosis not present

## 2017-06-09 DIAGNOSIS — Z96652 Presence of left artificial knee joint: Secondary | ICD-10-CM | POA: Diagnosis not present

## 2017-06-09 DIAGNOSIS — E871 Hypo-osmolality and hyponatremia: Secondary | ICD-10-CM | POA: Diagnosis not present

## 2017-06-17 DIAGNOSIS — K449 Diaphragmatic hernia without obstruction or gangrene: Secondary | ICD-10-CM | POA: Diagnosis not present

## 2017-07-29 DIAGNOSIS — R6889 Other general symptoms and signs: Secondary | ICD-10-CM | POA: Diagnosis not present

## 2017-07-29 DIAGNOSIS — J101 Influenza due to other identified influenza virus with other respiratory manifestations: Secondary | ICD-10-CM | POA: Diagnosis not present

## 2017-08-09 DIAGNOSIS — G2581 Restless legs syndrome: Secondary | ICD-10-CM | POA: Diagnosis not present

## 2017-08-16 ENCOUNTER — Other Ambulatory Visit: Payer: Self-pay | Admitting: Family Medicine

## 2017-08-16 DIAGNOSIS — D509 Iron deficiency anemia, unspecified: Secondary | ICD-10-CM | POA: Diagnosis not present

## 2017-08-16 DIAGNOSIS — D72819 Decreased white blood cell count, unspecified: Secondary | ICD-10-CM | POA: Diagnosis not present

## 2017-08-16 DIAGNOSIS — E669 Obesity, unspecified: Secondary | ICD-10-CM | POA: Diagnosis not present

## 2017-08-16 DIAGNOSIS — D649 Anemia, unspecified: Secondary | ICD-10-CM | POA: Diagnosis not present

## 2017-08-16 DIAGNOSIS — Z0000 Encounter for general adult medical examination without abnormal findings: Secondary | ICD-10-CM | POA: Diagnosis not present

## 2017-08-16 DIAGNOSIS — I10 Essential (primary) hypertension: Secondary | ICD-10-CM | POA: Diagnosis not present

## 2017-08-16 DIAGNOSIS — Z23 Encounter for immunization: Secondary | ICD-10-CM | POA: Diagnosis not present

## 2017-08-16 DIAGNOSIS — E785 Hyperlipidemia, unspecified: Secondary | ICD-10-CM | POA: Diagnosis not present

## 2017-08-16 DIAGNOSIS — Z79899 Other long term (current) drug therapy: Secondary | ICD-10-CM | POA: Diagnosis not present

## 2017-08-16 DIAGNOSIS — Z1231 Encounter for screening mammogram for malignant neoplasm of breast: Secondary | ICD-10-CM | POA: Diagnosis not present

## 2017-08-16 DIAGNOSIS — Z1239 Encounter for other screening for malignant neoplasm of breast: Secondary | ICD-10-CM

## 2017-08-16 DIAGNOSIS — G4733 Obstructive sleep apnea (adult) (pediatric): Secondary | ICD-10-CM | POA: Diagnosis not present

## 2017-08-16 DIAGNOSIS — E039 Hypothyroidism, unspecified: Secondary | ICD-10-CM | POA: Diagnosis not present

## 2017-08-18 DIAGNOSIS — I351 Nonrheumatic aortic (valve) insufficiency: Secondary | ICD-10-CM | POA: Diagnosis not present

## 2017-08-18 DIAGNOSIS — E785 Hyperlipidemia, unspecified: Secondary | ICD-10-CM | POA: Diagnosis not present

## 2017-08-18 DIAGNOSIS — I340 Nonrheumatic mitral (valve) insufficiency: Secondary | ICD-10-CM | POA: Diagnosis not present

## 2017-08-18 DIAGNOSIS — I10 Essential (primary) hypertension: Secondary | ICD-10-CM | POA: Diagnosis not present

## 2017-08-18 DIAGNOSIS — G4733 Obstructive sleep apnea (adult) (pediatric): Secondary | ICD-10-CM | POA: Diagnosis not present

## 2017-08-23 DIAGNOSIS — D509 Iron deficiency anemia, unspecified: Secondary | ICD-10-CM | POA: Insufficient documentation

## 2017-08-23 DIAGNOSIS — I10 Essential (primary) hypertension: Secondary | ICD-10-CM | POA: Diagnosis not present

## 2017-08-23 DIAGNOSIS — D649 Anemia, unspecified: Secondary | ICD-10-CM | POA: Diagnosis not present

## 2017-08-23 DIAGNOSIS — E039 Hypothyroidism, unspecified: Secondary | ICD-10-CM | POA: Diagnosis not present

## 2017-08-23 DIAGNOSIS — E785 Hyperlipidemia, unspecified: Secondary | ICD-10-CM | POA: Diagnosis not present

## 2017-08-23 DIAGNOSIS — Z79899 Other long term (current) drug therapy: Secondary | ICD-10-CM | POA: Diagnosis not present

## 2017-08-23 DIAGNOSIS — D72819 Decreased white blood cell count, unspecified: Secondary | ICD-10-CM | POA: Insufficient documentation

## 2017-08-25 DIAGNOSIS — H43813 Vitreous degeneration, bilateral: Secondary | ICD-10-CM | POA: Diagnosis not present

## 2017-10-05 DIAGNOSIS — D509 Iron deficiency anemia, unspecified: Secondary | ICD-10-CM | POA: Diagnosis not present

## 2017-10-05 DIAGNOSIS — K52831 Collagenous colitis: Secondary | ICD-10-CM | POA: Diagnosis not present

## 2017-10-05 DIAGNOSIS — K900 Celiac disease: Secondary | ICD-10-CM | POA: Diagnosis not present

## 2017-10-05 DIAGNOSIS — K529 Noninfective gastroenteritis and colitis, unspecified: Secondary | ICD-10-CM | POA: Diagnosis not present

## 2017-10-05 DIAGNOSIS — I8500 Esophageal varices without bleeding: Secondary | ICD-10-CM | POA: Diagnosis not present

## 2017-10-07 ENCOUNTER — Other Ambulatory Visit
Admission: RE | Admit: 2017-10-07 | Discharge: 2017-10-07 | Disposition: A | Discharge status: Home / Self Care | Payer: PPO | Source: Ambulatory Visit | Attending: Gastroenterology | Admitting: Gastroenterology

## 2017-10-07 DIAGNOSIS — K529 Noninfective gastroenteritis and colitis, unspecified: Secondary | ICD-10-CM | POA: Diagnosis not present

## 2017-10-07 LAB — C DIFFICILE QUICK SCREEN W PCR REFLEX
C DIFFICILE (CDIFF) INTERP: NOT DETECTED
C DIFFICLE (CDIFF) ANTIGEN: NEGATIVE
C Diff toxin: NEGATIVE

## 2017-10-13 DIAGNOSIS — D509 Iron deficiency anemia, unspecified: Secondary | ICD-10-CM | POA: Diagnosis not present

## 2017-11-08 ENCOUNTER — Other Ambulatory Visit: Payer: Self-pay | Admitting: Family Medicine

## 2017-11-09 DIAGNOSIS — R42 Dizziness and giddiness: Secondary | ICD-10-CM | POA: Diagnosis not present

## 2017-11-09 DIAGNOSIS — G2581 Restless legs syndrome: Secondary | ICD-10-CM | POA: Diagnosis not present

## 2017-11-15 DIAGNOSIS — D1801 Hemangioma of skin and subcutaneous tissue: Secondary | ICD-10-CM | POA: Diagnosis not present

## 2017-11-15 DIAGNOSIS — D225 Melanocytic nevi of trunk: Secondary | ICD-10-CM | POA: Diagnosis not present

## 2017-11-15 DIAGNOSIS — B353 Tinea pedis: Secondary | ICD-10-CM | POA: Diagnosis not present

## 2017-11-15 DIAGNOSIS — D2260 Melanocytic nevi of unspecified upper limb, including shoulder: Secondary | ICD-10-CM | POA: Diagnosis not present

## 2017-11-15 DIAGNOSIS — L820 Inflamed seborrheic keratosis: Secondary | ICD-10-CM | POA: Diagnosis not present

## 2017-11-15 DIAGNOSIS — I8393 Asymptomatic varicose veins of bilateral lower extremities: Secondary | ICD-10-CM | POA: Diagnosis not present

## 2017-11-15 DIAGNOSIS — L821 Other seborrheic keratosis: Secondary | ICD-10-CM | POA: Diagnosis not present

## 2017-11-15 DIAGNOSIS — D692 Other nonthrombocytopenic purpura: Secondary | ICD-10-CM | POA: Diagnosis not present

## 2017-11-15 DIAGNOSIS — I781 Nevus, non-neoplastic: Secondary | ICD-10-CM | POA: Diagnosis not present

## 2017-11-16 DIAGNOSIS — G2581 Restless legs syndrome: Secondary | ICD-10-CM | POA: Diagnosis not present

## 2017-11-16 DIAGNOSIS — K52831 Collagenous colitis: Secondary | ICD-10-CM | POA: Diagnosis not present

## 2017-11-16 DIAGNOSIS — R5383 Other fatigue: Secondary | ICD-10-CM | POA: Diagnosis not present

## 2017-11-16 DIAGNOSIS — E538 Deficiency of other specified B group vitamins: Secondary | ICD-10-CM | POA: Diagnosis not present

## 2017-11-16 DIAGNOSIS — K766 Portal hypertension: Secondary | ICD-10-CM | POA: Diagnosis not present

## 2017-11-16 DIAGNOSIS — G4733 Obstructive sleep apnea (adult) (pediatric): Secondary | ICD-10-CM | POA: Diagnosis not present

## 2017-11-16 DIAGNOSIS — E039 Hypothyroidism, unspecified: Secondary | ICD-10-CM | POA: Diagnosis not present

## 2017-11-16 DIAGNOSIS — D509 Iron deficiency anemia, unspecified: Secondary | ICD-10-CM | POA: Diagnosis not present

## 2017-11-16 DIAGNOSIS — R42 Dizziness and giddiness: Secondary | ICD-10-CM | POA: Diagnosis not present

## 2017-11-16 DIAGNOSIS — K900 Celiac disease: Secondary | ICD-10-CM | POA: Diagnosis not present

## 2017-11-22 DIAGNOSIS — E538 Deficiency of other specified B group vitamins: Secondary | ICD-10-CM | POA: Diagnosis not present

## 2017-11-29 DIAGNOSIS — E538 Deficiency of other specified B group vitamins: Secondary | ICD-10-CM | POA: Diagnosis not present

## 2017-12-05 IMAGING — MR MR KNEE*L* W/O CM
6 series · 40 of 40 positions shown · non-contrast
Comparison: None.

CLINICAL DATA: Left knee pain for 8 months.

EXAM:
MRI OF THE LEFT KNEE WITHOUT CONTRAST
TECHNIQUE: Multiplanar, multisequence MR imaging of the knee was performed. No
intravenous contrast was administered.

[Series 3: PD fat-sat · axial · 3.0mm · 0.62mm/px · z∈[-53,+59]mm · 9 of 35 slices shown (1 of 4)]
[im 1/35]
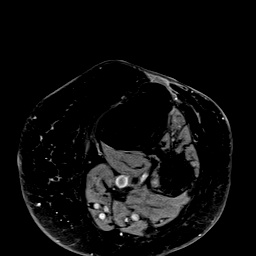
[im 5/35]
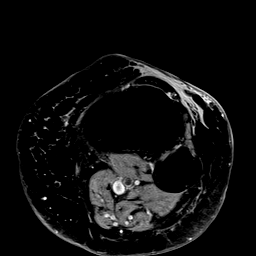
[im 9/35]
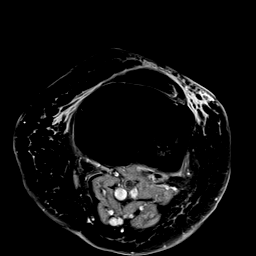
[im 13/35]
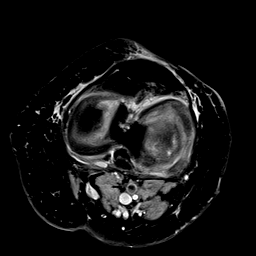
[im 18/35]
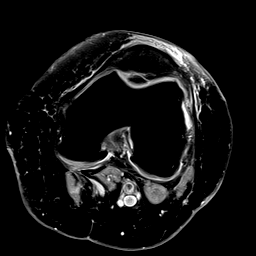
[im 22/35]
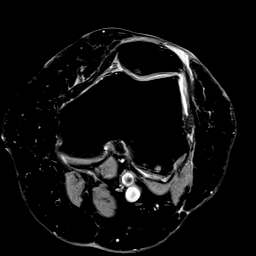
[im 26/35]
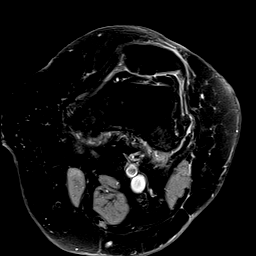
[im 30/35]
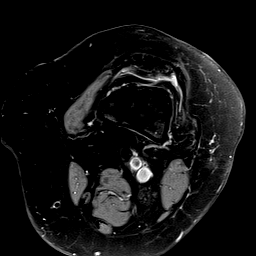
[im 35/35]
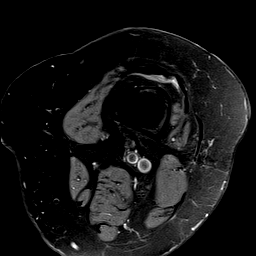

[Series 4: T1 · coronal · 3.0mm · 0.50mm/px · 7 of 31 slices shown]
[im 1/31]
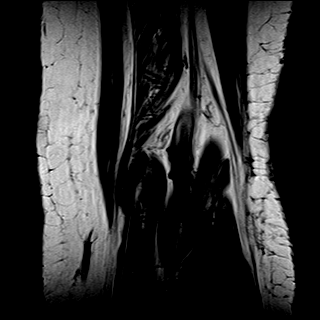
[im 6/31]
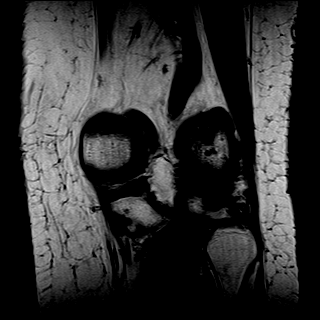
[im 11/31]
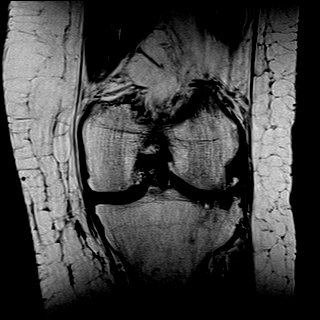
[im 16/31]
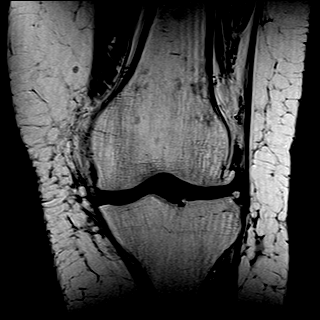
[im 21/31]
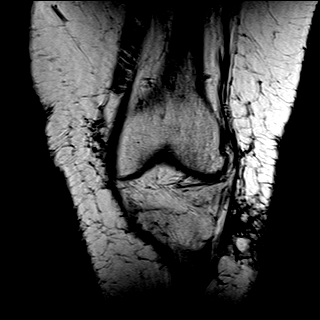
[im 26/31]
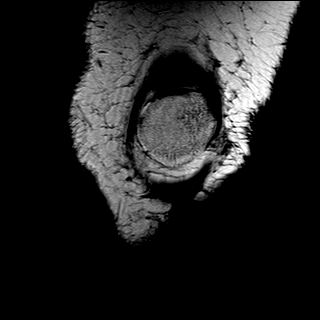
[im 31/31]
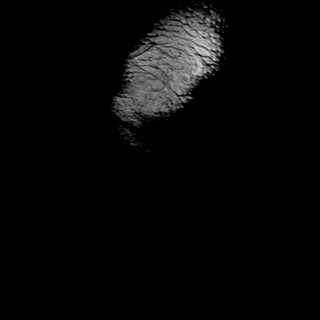

[Series 5: PD fat-sat · sagittal · 3.0mm · 0.50mm/px · 8 of 32 slices shown (2 of 4)]
[im 1/32]
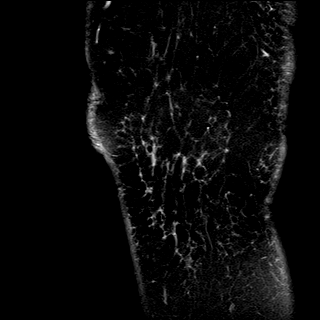
[im 5/32]
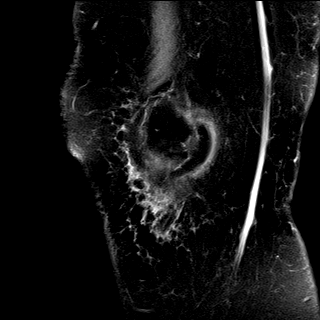
[im 9/32]
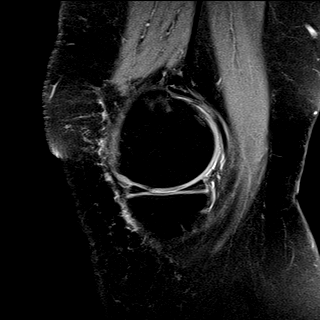
[im 14/32]
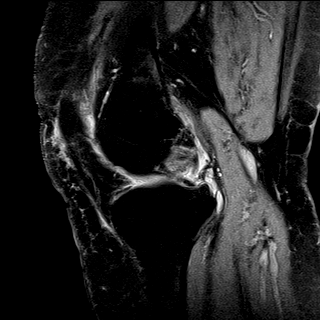
[im 18/32]
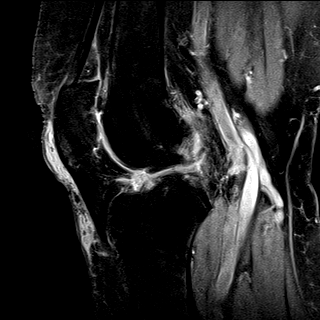
[im 23/32]
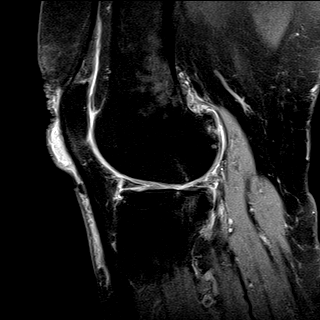
[im 27/32]
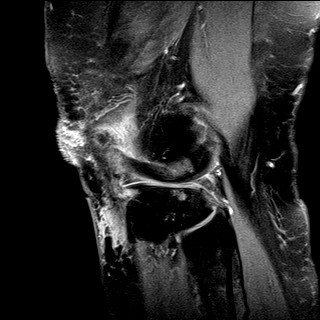
[im 32/32]
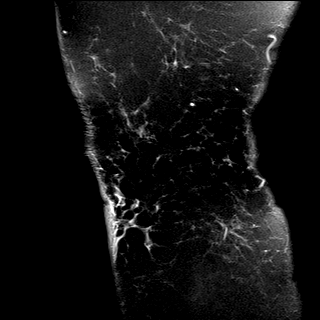

[Series 6: T2 fat-sat · coronal · 3.0mm · 0.31mm/px · 7 of 31 slices shown]
[im 1/31]
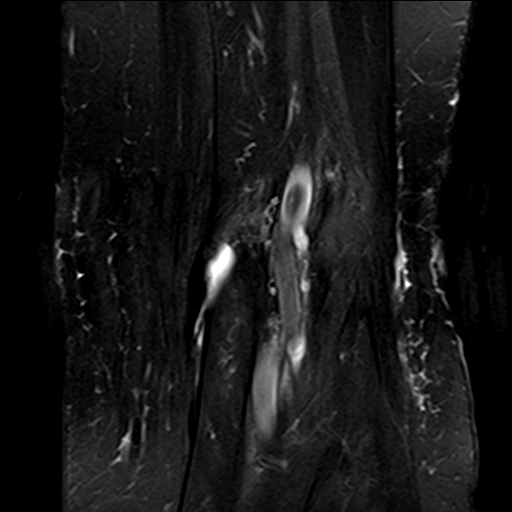
[im 6/31]
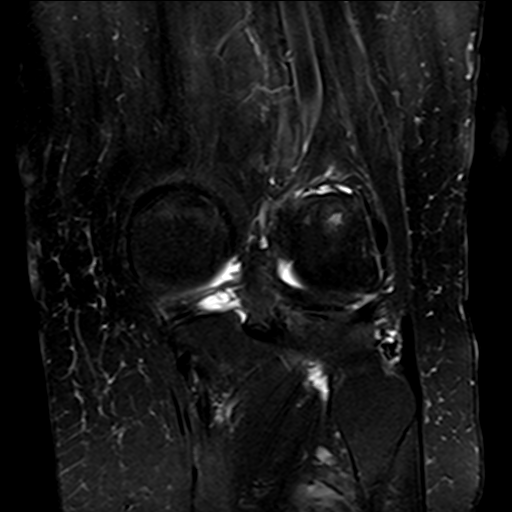
[im 11/31]
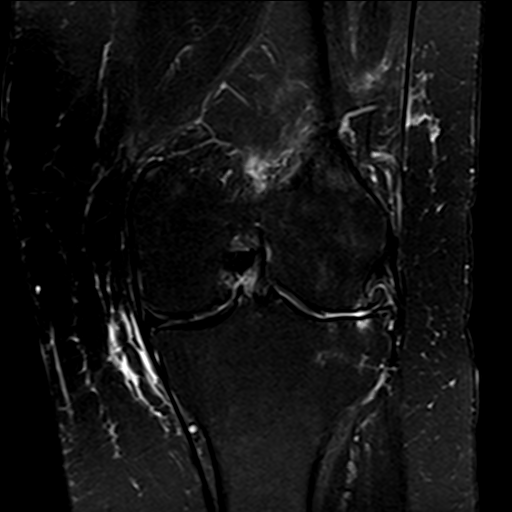
[im 16/31]
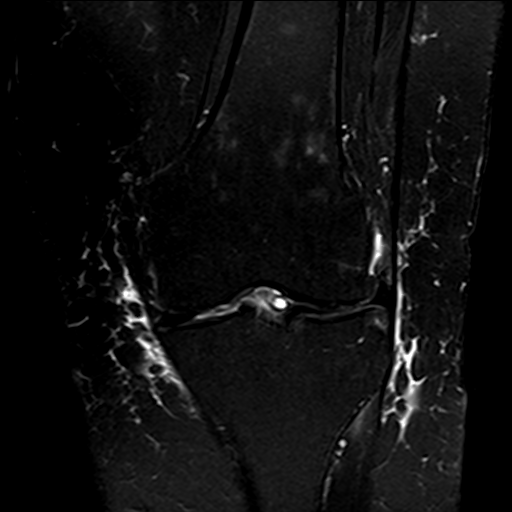
[im 21/31]
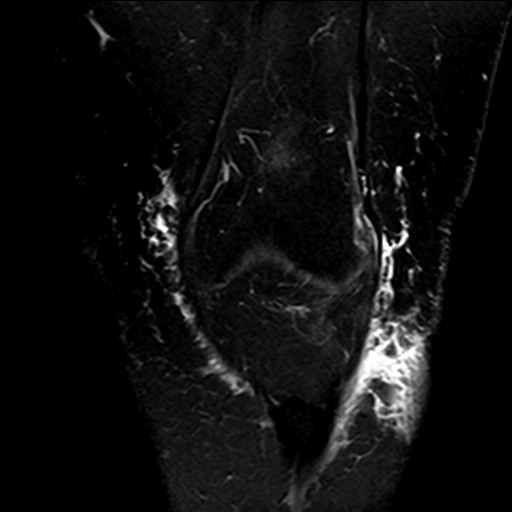
[im 26/31]
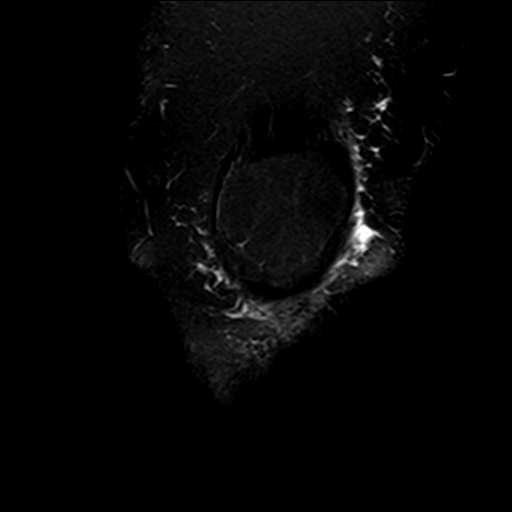
[im 31/31]
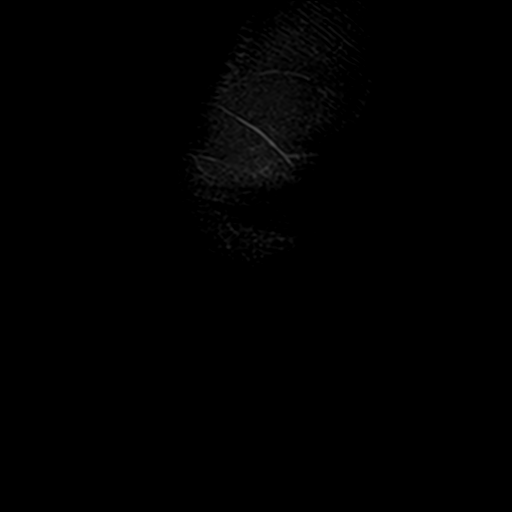

[Series 7: PD fat-sat · coronal · 3.0mm · 0.62mm/px · 7 of 31 slices shown (3 of 4)]
[im 1/31]
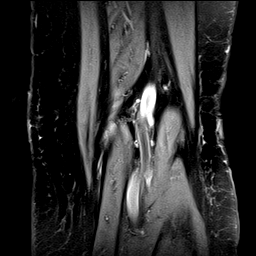
[im 6/31]
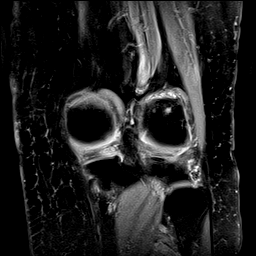
[im 11/31]
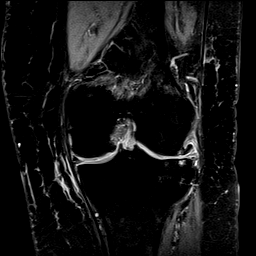
[im 16/31]
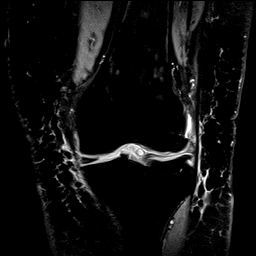
[im 21/31]
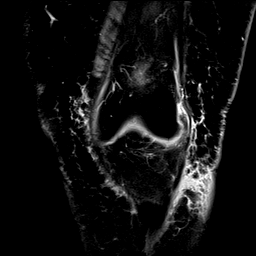
[im 26/31]
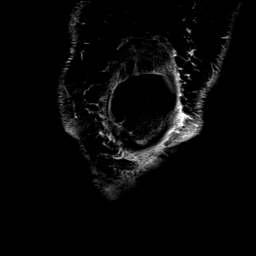
[im 31/31]
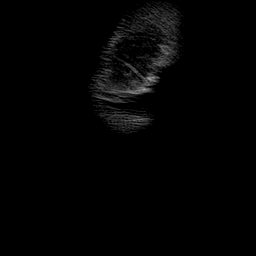

[Series 8: PD fat-sat · oblique · 2.0mm · 0.62mm/px · 2 of 9 slices shown (4 of 4)]
[im 1/9]
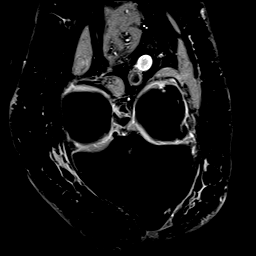
[im 9/9]
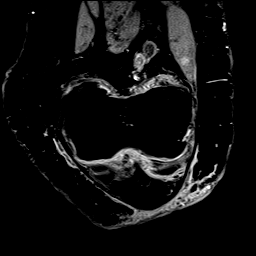

[40 of 40 positions shown; findings below may reference images not displayed]

FINDINGS: MENISCI

Medial meniscus: Intrasubstance degenerative changes along with free
edge fraying and fibrillation. No obvious tear.

Lateral meniscus: Severely degenerated and torn. The posterior horn
and midbody are macerated. The anterior horn is grossly intact.

LIGAMENTS

Cruciates:  Intact.  Mucoid degeneration of the ACL.

Collaterals:  Intact.

CARTILAGE

Patellofemoral: Advanced degenerative chondrosis with areas of grade
3 and grade 4 chondromalacia. Mild joint space narrowing and
spurring.

Medial: Moderate degenerative chondrosis/chondromalacia with areas
of full or near full-thickness cartilage loss, joint space narrowing
and early osteophytic spurring.

Lateral: Advanced degenerative chondrosis with areas full thickness
cartilage loss, significant joint space narrowing, spurring and
subchondral cystic change.

Joint:  No joint effusion.

Popliteal Fossa:  Small Baker's cyst.

Extensor Mechanism: The patella retinacular structures are intact
and the quadriceps and patellar tendons are intact.

Bones: No acute bony findings. Moderate subchondral cystic change
involving the lateral femoral condyle and lateral tibial plateau.

Other: None
IMPRESSION: 1. Degenerated and torn lateral meniscus.
2. Intact ligamentous structures and no acute bony findings.
3. Significant tricompartmental degenerative changes, most
significant in the lateral compartment.
4. No joint effusion.  Small Baker's cyst.

## 2017-12-06 DIAGNOSIS — E538 Deficiency of other specified B group vitamins: Secondary | ICD-10-CM | POA: Diagnosis not present

## 2017-12-13 DIAGNOSIS — E538 Deficiency of other specified B group vitamins: Secondary | ICD-10-CM | POA: Diagnosis not present

## 2018-01-27 DIAGNOSIS — M25562 Pain in left knee: Secondary | ICD-10-CM | POA: Diagnosis not present

## 2018-01-27 DIAGNOSIS — M25561 Pain in right knee: Secondary | ICD-10-CM | POA: Diagnosis not present

## 2018-01-27 DIAGNOSIS — Z96652 Presence of left artificial knee joint: Secondary | ICD-10-CM | POA: Diagnosis not present

## 2018-01-27 DIAGNOSIS — M1711 Unilateral primary osteoarthritis, right knee: Secondary | ICD-10-CM | POA: Diagnosis not present

## 2018-02-17 DIAGNOSIS — K52831 Collagenous colitis: Secondary | ICD-10-CM | POA: Diagnosis not present

## 2018-02-17 DIAGNOSIS — K900 Celiac disease: Secondary | ICD-10-CM | POA: Diagnosis not present

## 2018-02-17 DIAGNOSIS — I8500 Esophageal varices without bleeding: Secondary | ICD-10-CM | POA: Diagnosis not present

## 2018-02-21 DIAGNOSIS — I351 Nonrheumatic aortic (valve) insufficiency: Secondary | ICD-10-CM | POA: Diagnosis not present

## 2018-02-21 DIAGNOSIS — I10 Essential (primary) hypertension: Secondary | ICD-10-CM | POA: Diagnosis not present

## 2018-02-21 DIAGNOSIS — I340 Nonrheumatic mitral (valve) insufficiency: Secondary | ICD-10-CM | POA: Diagnosis not present

## 2018-02-21 DIAGNOSIS — G4733 Obstructive sleep apnea (adult) (pediatric): Secondary | ICD-10-CM | POA: Diagnosis not present

## 2018-02-21 DIAGNOSIS — E785 Hyperlipidemia, unspecified: Secondary | ICD-10-CM | POA: Diagnosis not present

## 2018-02-28 DIAGNOSIS — G2581 Restless legs syndrome: Secondary | ICD-10-CM | POA: Diagnosis not present

## 2018-02-28 DIAGNOSIS — I2510 Atherosclerotic heart disease of native coronary artery without angina pectoris: Secondary | ICD-10-CM | POA: Diagnosis not present

## 2018-02-28 DIAGNOSIS — G4733 Obstructive sleep apnea (adult) (pediatric): Secondary | ICD-10-CM | POA: Diagnosis not present

## 2018-02-28 DIAGNOSIS — E039 Hypothyroidism, unspecified: Secondary | ICD-10-CM | POA: Diagnosis not present

## 2018-02-28 DIAGNOSIS — R5383 Other fatigue: Secondary | ICD-10-CM | POA: Diagnosis not present

## 2018-02-28 DIAGNOSIS — I10 Essential (primary) hypertension: Secondary | ICD-10-CM | POA: Diagnosis not present

## 2018-02-28 DIAGNOSIS — K219 Gastro-esophageal reflux disease without esophagitis: Secondary | ICD-10-CM | POA: Diagnosis not present

## 2018-02-28 DIAGNOSIS — E538 Deficiency of other specified B group vitamins: Secondary | ICD-10-CM | POA: Diagnosis not present

## 2018-02-28 DIAGNOSIS — Z79899 Other long term (current) drug therapy: Secondary | ICD-10-CM | POA: Diagnosis not present

## 2018-02-28 DIAGNOSIS — E785 Hyperlipidemia, unspecified: Secondary | ICD-10-CM | POA: Diagnosis not present

## 2018-02-28 DIAGNOSIS — Z23 Encounter for immunization: Secondary | ICD-10-CM | POA: Diagnosis not present

## 2018-03-29 DIAGNOSIS — M79675 Pain in left toe(s): Secondary | ICD-10-CM | POA: Diagnosis not present

## 2018-03-29 DIAGNOSIS — M79674 Pain in right toe(s): Secondary | ICD-10-CM | POA: Diagnosis not present

## 2018-03-29 DIAGNOSIS — B351 Tinea unguium: Secondary | ICD-10-CM | POA: Diagnosis not present

## 2018-06-16 ENCOUNTER — Encounter: Payer: PPO | Admitting: Family Medicine

## 2018-06-16 DIAGNOSIS — Z0289 Encounter for other administrative examinations: Secondary | ICD-10-CM

## 2018-06-17 ENCOUNTER — Encounter: Payer: Self-pay | Admitting: Family Medicine

## 2018-06-17 ENCOUNTER — Ambulatory Visit (INDEPENDENT_AMBULATORY_CARE_PROVIDER_SITE_OTHER): Payer: PPO | Admitting: Family Medicine

## 2018-06-17 VITALS — BP 142/70 | HR 61 | Temp 98.3°F | Resp 16 | Ht 63.0 in | Wt 183.0 lb

## 2018-06-17 DIAGNOSIS — I10 Essential (primary) hypertension: Secondary | ICD-10-CM | POA: Diagnosis not present

## 2018-06-17 DIAGNOSIS — E538 Deficiency of other specified B group vitamins: Secondary | ICD-10-CM | POA: Diagnosis not present

## 2018-06-17 DIAGNOSIS — E559 Vitamin D deficiency, unspecified: Secondary | ICD-10-CM

## 2018-06-17 DIAGNOSIS — H81399 Other peripheral vertigo, unspecified ear: Secondary | ICD-10-CM

## 2018-06-17 DIAGNOSIS — E039 Hypothyroidism, unspecified: Secondary | ICD-10-CM | POA: Diagnosis not present

## 2018-06-17 LAB — CBC
HEMATOCRIT: 36.8 % (ref 36.0–46.0)
Hemoglobin: 12.5 g/dL (ref 12.0–15.0)
MCHC: 33.9 g/dL (ref 30.0–36.0)
MCV: 100 fl (ref 78.0–100.0)
Platelets: 204 10*3/uL (ref 150.0–400.0)
RBC: 3.68 Mil/uL — ABNORMAL LOW (ref 3.87–5.11)
RDW: 12.7 % (ref 11.5–15.5)
WBC: 4.3 10*3/uL (ref 4.0–10.5)

## 2018-06-17 LAB — THYROID PANEL WITH TSH
Free Thyroxine Index: 3.8 (ref 1.4–3.8)
T3 Uptake: 33 % (ref 22–35)
T4, Total: 11.4 ug/dL (ref 5.1–11.9)
TSH: 0.86 mIU/L (ref 0.40–4.50)

## 2018-06-17 LAB — COMPREHENSIVE METABOLIC PANEL
ALT: 13 U/L (ref 0–35)
AST: 13 U/L (ref 0–37)
Albumin: 3.9 g/dL (ref 3.5–5.2)
Alkaline Phosphatase: 74 U/L (ref 39–117)
BUN: 11 mg/dL (ref 6–23)
CO2: 32 meq/L (ref 19–32)
Calcium: 9.5 mg/dL (ref 8.4–10.5)
Chloride: 98 mEq/L (ref 96–112)
Creatinine, Ser: 0.54 mg/dL (ref 0.40–1.20)
GFR: 108.2 mL/min (ref >60.00)
Glucose, Bld: 94 mg/dL (ref 70–99)
POTASSIUM: 4.2 meq/L (ref 3.5–5.1)
Sodium: 134 mEq/L — ABNORMAL LOW (ref 135–145)
Total Bilirubin: 0.6 mg/dL (ref 0.2–1.2)
Total Protein: 5.6 g/dL — ABNORMAL LOW (ref 6.0–8.3)

## 2018-06-17 LAB — LIPID PANEL
CHOL/HDL RATIO: 4
Cholesterol: 159 mg/dL (ref 0–200)
HDL: 37.7 mg/dL — ABNORMAL LOW (ref >39.00)
LDL Cholesterol: 91 mg/dL (ref 0–99)
NonHDL: 120.86
Triglycerides: 148 mg/dL (ref 0.0–149.0)
VLDL: 29.6 mg/dL (ref 0.0–40.0)

## 2018-06-17 LAB — B12 AND FOLATE PANEL
Folate: 20.2 ng/mL (ref >5.9)
Vitamin B-12: 1430 pg/mL — ABNORMAL HIGH (ref 211–911)

## 2018-06-17 LAB — VITAMIN D 25 HYDROXY (VIT D DEFICIENCY, FRACTURES): VITD: 13.93 ng/mL — ABNORMAL LOW (ref 30.00–100.00)

## 2018-06-17 LAB — HEMOGLOBIN A1C: Hgb A1c MFr Bld: 5.4 % (ref 4.6–6.5)

## 2018-06-17 NOTE — Progress Notes (Signed)
Subjective:    Patient ID: Deborah Deleon, female    DOB: [birth year], 84 y.o.   MRN: [id number]  HPI   Patient presents to clinic as a transfer of care.  Main concern today is dizziness that she has been dealing with since August 2019.  Patient states she was told by past PCP she was seeing at [facility] clinic that it was related to inner ear issues and was prescribed meclizine to use as needed.  Patient does use the meclizine as needed, but still has times where she will feel dizzy and have to catch herself on the wall or sit down and rest.  Dizziness usually occurs with changing position from lying to sitting to standing, or a quick movement of her head.  Denies chest pain.  Denies severe headache.  Denies loss of visual field.  Denies speech difficulty or one-sided extremity weakness.  Denies any facial droop.  Patient also has a history including hypertension, hypothyroidism, B12 deficiency and per patient she has not had lab work checked in a long time.   Overall blood pressure has been controlled at home, did have a few episodes where it was elevated in the 180s over 90s, but after resting and rechecking blood pressure improved to the 140s over 70s.  Patient does see cardiology at least every 6 months due to history of coronary artery disease and aortic insufficiency.  Overall patient is tolerating her levothyroxine without issue.  Unsure of her last TSH.  Patient states she was getting B12 injections for a while to correct B12 deficiency.  Currently she is on oral B12 replacement.  I was able to review her past medical notes from congenital clinic via care everywhere in epic.  Past medical history, surgical history, social and family history updated accordingly in chart.  Past Medical History:  Diagnosis Date  . Allergy   . Cataract cortical, senile   . Celiac disease   . Chicken pox   . Collagenous colitis   . Degenerative joint disease   . Diverticulosis   . Family history of  adverse reaction to anesthesia    daughter climbs the walls with anesthesia  . GERD (gastroesophageal reflux disease)   . Hypertension   . Hypothyroidism   . Restless leg    Social History   Tobacco Use  . Smoking status: Never Smoker  . Smokeless tobacco: Never Used  Substance Use Topics  . Alcohol use: No   Family History  Problem Relation Age of Onset  . Breast cancer Sister   . Heart disease Mother   . Diabetes Mother   . Lupus Mother   . Arthritis Father   . Heart disease Father   . Aneurysm Father    Past Surgical History:  Procedure Laterality Date  . ABDOMINAL HYSTERECTOMY  1971  . APPENDECTOMY  1958  . CATARACT EXTRACTION W/ INTRAOCULAR LENS IMPLANT Bilateral 2010   left done April and right done in May  . CHOLECYSTECTOMY  1979  . COLONOSCOPY WITH PROPOFOL N/A 01/21/2016   Procedure: COLONOSCOPY WITH PROPOFOL;  Surgeon: Lollie Sails, MD;  Location: [facility] ENDOSCOPY;  Service: Endoscopy;  Laterality: N/A;  . KNEE ARTHROPLASTY Left 04/14/2017   Procedure: COMPUTER ASSISTED TOTAL KNEE ARTHROPLASTY;  Surgeon: Dereck Leep, MD;  Location: [facility] ORS;  Service: Orthopedics;  Laterality: Left;  . KNEE ARTHROSCOPY Left 02/17/2016   Procedure: ARTHROSCOPY KNEE Chondraplasty;  Surgeon: Dereck Leep, MD;  Location: [facility] ORS;  Service: Orthopedics;  Laterality: Left;  .  ROTATOR CUFF REPAIR Left 2008  . TONSILLECTOMY     Review of Systems  Constitutional: Negative for chills, fatigue and fever.  HENT: Negative for congestion, ear pain, sinus pain and sore throat.   Eyes: Negative.   Respiratory: Negative for cough, shortness of breath and wheezing.   Cardiovascular: Negative for chest pain, palpitations and leg swelling.  Gastrointestinal: Negative for abdominal pain, diarrhea, nausea and vomiting.  Genitourinary: Negative for dysuria, frequency and urgency.  Musculoskeletal: Negative for arthralgias and myalgias.  Skin: Negative for color change, pallor and rash.    Neurological: Negative for syncope. +dizziness x months Psychiatric/Behavioral: The patient is not nervous/anxious.       Objective:   Physical Exam   Constitutional: She appears well-developed and well-nourished. No distress.  HENT:  Head: Normocephalic and atraumatic.  Eyes: Pupils are equal, round, and reactive to light. EOM are normal. No scleral icterus.  Neck: Normal range of motion. Neck supple. No tracheal deviation present.  Cardiovascular: Normal rate, regular rhythm and normal heart sounds.  Pulmonary/Chest: Effort normal and breath sounds normal. No respiratory distress. She has no wheezes. She has no rales.  Abdominal: Soft. Bowel sounds are normal. There is no tenderness.  Neurological: She is alert and oriented to person, place, and time. Gait normal.  Negative Romberg.  Smile symmetrical, speech clear, can raise eyebrows/puff out cheeks/clenched teeth without issue.  Grips equal and strong.  Skin: Skin is warm and dry. No pallor.  Psychiatric: She has a normal mood and affect. Her behavior is normal. Thought content normal.  Nursing note and vitals reviewed.   Vitals:   06/18/18 0922  BP: (!) 142/70  Pulse: 61  Resp: 16  Temp: 98.3 F (36.8 C)  SpO2: 97%       Assessment & Plan:    A total of 45  minutes were spent face-to-face with the patient during this encounter and over half of that time was spent on counseling and coordination of care. The patient was counseled on labs we will get, possible dizziness causes.   Dizziness- most likely related to benign positional vertigo.  We will get MRI to rule out any other causes of dizziness.  We will also get updated lab work to look for any anemia, electrolyte or vitamin deficiencies that could exacerbate dizziness.  Essential hypertension- blood pressure well controlled on current medication regimen at this time.  We will monitor.  Hypothyroidism-patient will continue current levothyroxine dose at this time we  will continue thyroid panel.  B12 deficiency/vitamin D deficiency-we will continue B12 labs today to check her levels and to see if she needs to go back on B12 injections.  We will also get vitamin D levels checked in lab work as well.  Patient aware someone will contact her in regards to setting up MRI.  We will put a six-month follow-up on the book for management of chronic conditions.  Based on results of her lab work and MRI, we will determine when a sooner follow-up is needed.  Patient aware she can return to clinic sooner if any issues arise.

## 2018-06-18 MED ORDER — VITAMIN D (ERGOCALCIFEROL) 1.25 MG (50000 UNIT) PO CAPS: 50000.0000 [IU] | ORAL_CAPSULE | ORAL | 0 refills | Status: DC

## 2018-06-18 NOTE — Addendum Note (Signed)
Addended by: Philis Nettle on: 06/18/2018 12:27 PM   Modules accepted: Orders

## 2018-06-20 ENCOUNTER — Telehealth: Payer: Self-pay | Admitting: Family Medicine

## 2018-06-20 NOTE — Telephone Encounter (Signed)
Copied from [place] [id number]. Topic: General - Other >> Jun 20, 2018 10:39 AM Yvette Rack wrote: Reason for CRM: pt calling about labs

## 2018-06-23 ENCOUNTER — Ambulatory Visit: Payer: PPO | Admitting: *Deleted

## 2018-06-27 ENCOUNTER — Ambulatory Visit
Admission: RE | Admit: 2018-06-27 | Discharge: 2018-06-27 | Disposition: A | Discharge status: Home / Self Care | Payer: PPO | Source: Ambulatory Visit | Attending: Family Medicine | Admitting: Family Medicine

## 2018-06-27 DIAGNOSIS — R42 Dizziness and giddiness: Secondary | ICD-10-CM | POA: Diagnosis not present

## 2018-06-27 DIAGNOSIS — H81399 Other peripheral vertigo, unspecified ear: Secondary | ICD-10-CM | POA: Insufficient documentation

## 2018-06-29 ENCOUNTER — Other Ambulatory Visit: Payer: Self-pay | Admitting: Family Medicine

## 2018-06-29 DIAGNOSIS — H81399 Other peripheral vertigo, unspecified ear: Secondary | ICD-10-CM

## 2018-07-14 DIAGNOSIS — R42 Dizziness and giddiness: Secondary | ICD-10-CM | POA: Diagnosis not present

## 2018-08-18 DIAGNOSIS — G4733 Obstructive sleep apnea (adult) (pediatric): Secondary | ICD-10-CM | POA: Diagnosis not present

## 2018-08-18 DIAGNOSIS — I10 Essential (primary) hypertension: Secondary | ICD-10-CM | POA: Diagnosis not present

## 2018-08-18 DIAGNOSIS — I351 Nonrheumatic aortic (valve) insufficiency: Secondary | ICD-10-CM | POA: Diagnosis not present

## 2018-08-18 DIAGNOSIS — I340 Nonrheumatic mitral (valve) insufficiency: Secondary | ICD-10-CM | POA: Diagnosis not present

## 2018-08-18 DIAGNOSIS — I2510 Atherosclerotic heart disease of native coronary artery without angina pectoris: Secondary | ICD-10-CM | POA: Diagnosis not present

## 2018-08-29 ENCOUNTER — Other Ambulatory Visit: Payer: Self-pay | Admitting: Family Medicine

## 2018-08-29 NOTE — Telephone Encounter (Signed)
Requested medication (s) are due for refill today: yes  Requested medication (s) are on the active medication list: yes  Last refill:  06/23/16 #90 with 3 refills by dr. Lacinda Axon  Future visit scheduled: yes  Notes to clinic:  Last prescription noted to be in 2018 and was filled by Dr. Lacinda Axon    Requested Prescriptions  Pending Prescriptions Disp Refills   levothyroxine (SYNTHROID, LEVOTHROID) 125 MCG tablet 90 tablet 3    Sig: Take 1 tablet (125 mcg total) by mouth daily with breakfast.     Endocrinology:  Hypothyroid Agents Failed - 08/29/2018  5:35 PM      Failed - TSH needs to be rechecked within 3 months after an abnormal result. Refill until TSH is due.      Passed - TSH in normal range and within 360 days    TSH  Date Value Ref Range Status  06/17/2018 0.86 0.40 - 4.50 mIU/L Final         Passed - Valid encounter within last 12 months    Recent Outpatient Visits          2 months ago Dizziness   [place] Guse, Jacquelynn Cree, FNP   2 years ago Venous stasis   [facility], [place], [place], [place]   2 years ago Chronic cough   [place], [place], [place]   2 years ago Encounter for general adult medical examination with abnormal findings   [place], [place], DO      Future Appointments            In 2 weeks Guse, Jacquelynn Cree, [place], [place]   In 3 months Guse, Jacquelynn Cree, [place], [place] [facility]

## 2018-08-31 ENCOUNTER — Other Ambulatory Visit: Payer: Self-pay | Admitting: Family Medicine

## 2018-08-31 MED ORDER — LEVOTHYROXINE SODIUM 125 MCG PO TABS: 125.0000 ug | ORAL_TABLET | Freq: Every day | ORAL | 3 refills | Status: DC

## 2018-09-13 ENCOUNTER — Other Ambulatory Visit: Payer: Self-pay

## 2018-09-13 ENCOUNTER — Ambulatory Visit (INDEPENDENT_AMBULATORY_CARE_PROVIDER_SITE_OTHER): Payer: PPO | Admitting: Family Medicine

## 2018-09-13 ENCOUNTER — Encounter: Payer: Self-pay | Admitting: Family Medicine

## 2018-09-13 ENCOUNTER — Telehealth: Payer: Self-pay | Admitting: Family Medicine

## 2018-09-13 VITALS — BP 138/66 | HR 55 | Temp 98.0°F | Resp 18 | Ht 65.0 in | Wt 182.2 lb

## 2018-09-13 DIAGNOSIS — I10 Essential (primary) hypertension: Secondary | ICD-10-CM

## 2018-09-13 DIAGNOSIS — I340 Nonrheumatic mitral (valve) insufficiency: Secondary | ICD-10-CM

## 2018-09-13 DIAGNOSIS — H81399 Other peripheral vertigo, unspecified ear: Secondary | ICD-10-CM | POA: Diagnosis not present

## 2018-09-13 DIAGNOSIS — M62830 Muscle spasm of back: Secondary | ICD-10-CM | POA: Diagnosis not present

## 2018-09-13 DIAGNOSIS — E559 Vitamin D deficiency, unspecified: Secondary | ICD-10-CM | POA: Diagnosis not present

## 2018-09-13 DIAGNOSIS — E039 Hypothyroidism, unspecified: Secondary | ICD-10-CM

## 2018-09-13 DIAGNOSIS — I351 Nonrheumatic aortic (valve) insufficiency: Secondary | ICD-10-CM

## 2018-09-13 DIAGNOSIS — E611 Iron deficiency: Secondary | ICD-10-CM

## 2018-09-13 LAB — BASIC METABOLIC PANEL
BUN: 18 mg/dL (ref 6–23)
CO2: 30 mEq/L (ref 19–32)
Calcium: 9.6 mg/dL (ref 8.4–10.5)
Chloride: 98 mEq/L (ref 96–112)
Creatinine, Ser: 0.54 mg/dL (ref 0.40–1.20)
GFR: 108.13 mL/min (ref >60.00)
Glucose, Bld: 99 mg/dL (ref 70–99)
Potassium: 4.4 mEq/L (ref 3.5–5.1)
Sodium: 134 mEq/L — ABNORMAL LOW (ref 135–145)

## 2018-09-13 LAB — CBC WITH DIFFERENTIAL/PLATELET
Basophils Absolute: 0.1 10*3/uL (ref 0.0–0.1)
Basophils Relative: 1.3 % (ref 0.0–3.0)
Eosinophils Absolute: 0.1 10*3/uL (ref 0.0–0.7)
Eosinophils Relative: 1.5 % (ref 0.0–5.0)
HCT: 35.9 % — ABNORMAL LOW (ref 36.0–46.0)
Hemoglobin: 12.5 g/dL (ref 12.0–15.0)
Lymphocytes Relative: 48.8 % — ABNORMAL HIGH (ref 12.0–46.0)
Lymphs Abs: 2 10*3/uL (ref 0.7–4.0)
MCHC: 34.6 g/dL (ref 30.0–36.0)
MCV: 101.4 fl — ABNORMAL HIGH (ref 78.0–100.0)
Monocytes Absolute: 0.4 10*3/uL (ref 0.1–1.0)
Monocytes Relative: 10.8 % (ref 3.0–12.0)
Neutro Abs: 1.6 10*3/uL (ref 1.4–7.7)
Neutrophils Relative %: 37.6 % — ABNORMAL LOW (ref 43.0–77.0)
Platelets: 220 10*3/uL (ref 150.0–400.0)
RBC: 3.54 Mil/uL — ABNORMAL LOW (ref 3.87–5.11)
RDW: 14.5 % (ref 11.5–15.5)
WBC: 4.1 10*3/uL (ref 4.0–10.5)

## 2018-09-13 LAB — VITAMIN D 25 HYDROXY (VIT D DEFICIENCY, FRACTURES): VITD: 32.74 ng/mL (ref 30.00–100.00)

## 2018-09-13 LAB — IBC + FERRITIN
Ferritin: 121.9 ng/mL (ref 10.0–291.0)
Iron: 110 ug/dL (ref 42–145)
Saturation Ratios: 37.1 % (ref 20.0–50.0)
Transferrin: 212 mg/dL (ref 212.0–360.0)

## 2018-09-13 NOTE — Progress Notes (Addendum)
Subjective:    Patient ID: Deborah Deleon, female    DOB: 08-10-1934, 84 y.o.   MRN: [id number]  HPI   Patient presents to clinic for follow-up on blood pressure/heart disease, vitamin D deficiency, hypothyroidism, iron deficiency and dizziness.  Patient states since starting the vitamin D supplement, her energy level feels somewhat improved.  However she does not feel as much of a get up and go that she had a few years ago.  She is wondering if her iron deficiency has returned even though she is taking iron supplements.  Patient continues to have breakthrough episodes of dizziness, but is able to sit down and they will pass after a few minutes.  Patient had a big work-up to investigate dizziness including MRI of brain and ENT evaluation, and it was determined that her dizziness is most likely related to benign positional vertigo.  Patient has been tolerating her levothyroxine without any problems.  Most recent lab work from last visit did show a normal thyroid panel.  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 States she has some mid back pain/muscle aches and thinks it is from leaning forward over sewing machine for many hours.   Patient Active Problem List   Diagnosis Date Noted  . S/P total knee arthroplasty 04/14/2017  . Primary osteoarthritis of left knee 04/04/2017  . OSA on CPAP 03/22/2017  . Other and unspecified hyperlipidemia 02/18/2017  . Venous stasis 07/10/2016  . CAD (coronary artery disease) 06/18/2016  . Aortic insufficiency 06/18/2016  . Mitral regurgitation 06/18/2016  . Essential hypertension 12/22/2015  . Collagenous colitis 12/22/2015  . Celiac disease 12/22/2015  . Hypothyroidism 01/09/2015  . Restless leg syndrome 01/09/2015  . Pulmonary nodule 11/27/2013  . GERD (gastroesophageal reflux disease) 11/13/2013   Social History    Tobacco Use  . Smoking status: Never Smoker  . Smokeless tobacco: Never Used  Substance Use Topics  . Alcohol use: No   Review of Systems  Constitutional: Negative for chills, and fever. +fatigue HENT: Negative for congestion, ear pain, sinus pain and sore throat.   Eyes: Negative.   Respiratory: Negative for cough, shortness of breath and wheezing.   Cardiovascular: Negative for chest pain, palpitations and leg swelling.  Gastrointestinal: Negative for abdominal pain, diarrhea, nausea and vomiting.  Genitourinary: Negative for dysuria, frequency and urgency.  Musculoskeletal: Negative for arthralgias and myalgias.  Skin: Negative for color change, pallor and rash.  Neurological: Negative for syncope, light-headedness and headaches.  Psychiatric/Behavioral: The patient is not nervous/anxious.       Objective:   Physical Exam  Constitutional: She is oriented to person, place, and time. She appears well-developed and well-nourished. No distress.  HENT:  Head: Normocephalic and atraumatic.  Eyes: Pupils are equal, round, and reactive to light. EOM are normal. No scleral icterus.  Neck: Normal range of motion. Neck supple. No tracheal deviation present.  Cardiovascular: Normal rate, regular rhythm and normal heart sounds.  Pulmonary/Chest: Effort normal and breath sounds normal. No respiratory distress.  Neurological: She is alert and oriented to person, place, and time.  Gait normal  Skin: Skin is warm and dry. No pallor.  Psychiatric: She has a normal mood and affect. Her behavior is normal. Thought content normal.   Nursing note and vitals reviewed.   Vitals:   09/13/18 1358  BP: 138/66  Pulse: (!) 55  Resp: 18  Temp: 98 F (36.7 C)  SpO2: 98%      Assessment & Plan:   Hypertension/aortic valve insufficiency/mitral valve insufficiency- patient's blood pressure and heart rate/rhythm are currently stable on her medications.  She will continue these medications and  also continue regular follow-up with cardiology.  Vitamin D deficiency-we will recheck vitamin D level and lab work today to assess if any changes in vitamin D supplementation need to be made.  Hypothyroidism- thyroid levels are stable.  She will continue current levothyroxine dose.  Iron deficiency- we will check iron panel and lab work to see if this is contributing to her fatigue/dizziness.  Dizziness- patient's work-up regarding her dizziness is reassuring that she does not have something such as brain tumor causing symptoms and most likely is BPPV.  I also wonder if some of the dizziness she has could be related to her cardiac issues.  Muscle spasm in back -- Patient advised she can use tylenol as needed for pain and also will try flexeril as needed. Also discussed topical rub like bengay or biofreeze, using a heating pad and doing stretches to avoid stiffness.   We will have patient follow-up in 3 months for management of chronic conditions.  She is aware she can return to clinic sooner if any issues arise.  Lab work drawn in clinic today.

## 2018-09-13 NOTE — Telephone Encounter (Signed)
Is todays visit a doxy? Does not say in the notes  Thanks,  LG

## 2018-09-13 NOTE — Telephone Encounter (Signed)
Office visit

## 2018-09-14 ENCOUNTER — Telehealth: Payer: Self-pay | Admitting: Family Medicine

## 2018-09-14 NOTE — Telephone Encounter (Signed)
Pt called she said that she was seen in the office on yesterday for back pain. And was told that a rx would called in to the pharmacy , pt has check with pharmacy and nothing has has been sent in. Please advise

## 2018-09-14 NOTE — Telephone Encounter (Signed)
I don't see anything in your note about patient back pain?

## 2018-09-15 MED ORDER — ERGOCALCIFEROL 50 MCG (2000 UT) PO TABS: 1.0000 | ORAL_TABLET | Freq: Every day | ORAL | 1 refills | Status: DC

## 2018-09-15 MED ORDER — CYCLOBENZAPRINE HCL 5 MG PO TABS: 5.0000 mg | ORAL_TABLET | Freq: Three times a day (TID) | ORAL | 1 refills | Status: DC | PRN

## 2018-09-15 NOTE — Addendum Note (Signed)
Addended by: Philis Nettle on: 09/15/2018 08:15 AM   Modules accepted: Orders

## 2018-09-15 NOTE — Telephone Encounter (Signed)
My mistake, I forgot to send Rx in after I ordered in lab work.   Muscle relaxer sent in. Also her lab results are in -- see lab note for results for patient

## 2018-09-15 NOTE — Telephone Encounter (Signed)
Call Pt No answer left a VM to call office, to tell Pt NP Philis Nettle stated  her Iron levels look good, continue current iron supplement. Vitamin D improved, now low end of normal - I will send in smaller daily dose of vitamin D for her to take. Electrolytes, liver and kidney normal. Blood counts are normal. Also a Rx of muscle relaxer was sent in to the pharmacy for her back. Okay for Pec to speak to Pt

## 2018-09-16 ENCOUNTER — Telehealth: Payer: Self-pay

## 2018-09-16 NOTE — Telephone Encounter (Signed)
[facility],  they were having technical difficulties. Was told to Call back later.

## 2018-09-16 NOTE — Telephone Encounter (Signed)
Copied from [place] [id number]. Topic: General - Inquiry >> Sep 16, 2018 12:55 PM Richardo Priest, NT wrote: Reason for CRM: Ray, from [facility], called in in regards to Deborah Deleon for a confirmed diagnoses. Call back number is [phone] option 3. Reference number [id number]

## 2018-09-19 NOTE — Telephone Encounter (Signed)
Medication was approved through [employer]

## 2018-09-20 DIAGNOSIS — K219 Gastro-esophageal reflux disease without esophagitis: Secondary | ICD-10-CM | POA: Diagnosis not present

## 2018-09-20 DIAGNOSIS — K228 Other specified diseases of esophagus: Secondary | ICD-10-CM | POA: Diagnosis not present

## 2018-09-20 DIAGNOSIS — K449 Diaphragmatic hernia without obstruction or gangrene: Secondary | ICD-10-CM | POA: Diagnosis not present

## 2018-09-20 DIAGNOSIS — K900 Celiac disease: Secondary | ICD-10-CM | POA: Diagnosis not present

## 2018-09-20 DIAGNOSIS — K52831 Collagenous colitis: Secondary | ICD-10-CM | POA: Diagnosis not present

## 2018-09-29 DIAGNOSIS — K449 Diaphragmatic hernia without obstruction or gangrene: Secondary | ICD-10-CM | POA: Diagnosis not present

## 2018-10-05 ENCOUNTER — Telehealth: Payer: Self-pay | Admitting: *Deleted

## 2018-10-05 ENCOUNTER — Encounter: Payer: Self-pay | Admitting: Family

## 2018-10-05 ENCOUNTER — Other Ambulatory Visit: Payer: Self-pay

## 2018-10-05 ENCOUNTER — Other Ambulatory Visit (INDEPENDENT_AMBULATORY_CARE_PROVIDER_SITE_OTHER): Payer: PPO

## 2018-10-05 ENCOUNTER — Ambulatory Visit (INDEPENDENT_AMBULATORY_CARE_PROVIDER_SITE_OTHER): Payer: PPO | Admitting: Family

## 2018-10-05 DIAGNOSIS — R300 Dysuria: Secondary | ICD-10-CM

## 2018-10-05 MED ORDER — NITROFURANTOIN MONOHYD MACRO 100 MG PO CAPS: 100.0000 mg | ORAL_CAPSULE | Freq: Two times a day (BID) | ORAL | 0 refills | Status: DC

## 2018-10-05 NOTE — Telephone Encounter (Signed)
I put in Can I have a POC UA done so I can see if leuks, rbcs etc for our virtual visit? Helps me make a decision in regard to infection.Marland Kitchen

## 2018-10-05 NOTE — Patient Instructions (Signed)
Suspect urinary tract infection.  may start Macrobid.  Ensure to take probiotics while on antibiotics and also for 2 weeks after completion. It is important to re-colonize the gut with good bacteria and also to prevent any diarrheal infections associated with antibiotic use.   We will certainly call you soon as we get urine culture, urine results.  Plenty of water.  Please let me know of any new or worsening symptoms   Urinary Tract Infection, Adult  A urinary tract infection (UTI) is an infection of any part of the urinary tract. The urinary tract includes the kidneys, ureters, bladder, and urethra. These organs make, store, and get rid of urine in the body. Your health care provider may use other names to describe the infection. An upper UTI affects the ureters and kidneys (pyelonephritis). A lower UTI affects the bladder (cystitis) and urethra (urethritis). What are the causes? Most urinary tract infections are caused by bacteria in your genital area, around the entrance to your urinary tract (urethra). These bacteria grow and cause inflammation of your urinary tract. What increases the risk? You are more likely to develop this condition if:  You have a urinary catheter that stays in place (indwelling).  You are not able to control when you urinate or have a bowel movement (you have incontinence).  You are female and you: ? Use a spermicide or diaphragm for birth control. ? Have low estrogen levels. ? Are pregnant.  You have certain genes that increase your risk (genetics).  You are sexually active.  You take antibiotic medicines.  You have a condition that causes your flow of urine to slow down, such as: ? An enlarged prostate, if you are female. ? Blockage in your urethra (stricture). ? A kidney stone. ? A nerve condition that affects your bladder control (neurogenic bladder). ? Not getting enough to drink, or not urinating often.  You have certain medical conditions, such  as: ? Diabetes. ? A weak disease-fighting system (immunesystem). ? Sickle cell disease. ? Gout. ? Spinal cord injury. What are the signs or symptoms? Symptoms of this condition include:  Needing to urinate right away (urgently).  Frequent urination or passing small amounts of urine frequently.  Pain or burning with urination.  Blood in the urine.  Urine that smells bad or unusual.  Trouble urinating.  Cloudy urine.  Vaginal discharge, if you are female.  Pain in the abdomen or the lower back. You may also have:  Vomiting or a decreased appetite.  Confusion.  Irritability or tiredness.  A fever.  Diarrhea. The first symptom in older adults may be confusion. In some cases, they may not have any symptoms until the infection has worsened. How is this diagnosed? This condition is diagnosed based on your medical history and a physical exam. You may also have other tests, including:  Urine tests.  Blood tests.  Tests for sexually transmitted infections (STIs). If you have had more than one UTI, a cystoscopy or imaging studies may be done to determine the cause of the infections. How is this treated? Treatment for this condition includes:  Antibiotic medicine.  Over-the-counter medicines to treat discomfort.  Drinking enough water to stay hydrated. If you have frequent infections or have other conditions such as a kidney stone, you may need to see a health care provider who specializes in the urinary tract (urologist). In rare cases, urinary tract infections can cause sepsis. Sepsis is a life-threatening condition that occurs when the body responds to an infection.  Sepsis is treated in the hospital with IV antibiotics, fluids, and other medicines. Follow these instructions at home:  Medicines  Take over-the-counter and prescription medicines only as told by your health care provider.  If you were prescribed an antibiotic medicine, take it as told by your health  care provider. Do not stop using the antibiotic even if you start to feel better. General instructions  Make sure you: ? Empty your bladder often and completely. Do not hold urine for long periods of time. ? Empty your bladder after sex. ? Wipe from front to back after a bowel movement if you are female. Use each tissue one time when you wipe.  Drink enough fluid to keep your urine pale yellow.  Keep all follow-up visits as told by your health care provider. This is important. Contact a health care provider if:  Your symptoms do not get better after 1-2 days.  Your symptoms go away and then return. Get help right away if you have:  Severe pain in your back or your lower abdomen.  A fever.  Nausea or vomiting. Summary  A urinary tract infection (UTI) is an infection of any part of the urinary tract, which includes the kidneys, ureters, bladder, and urethra.  Most urinary tract infections are caused by bacteria in your genital area, around the entrance to your urinary tract (urethra).  Treatment for this condition often includes antibiotic medicines.  If you were prescribed an antibiotic medicine, take it as told by your health care provider. Do not stop using the antibiotic even if you start to feel better.  Keep all follow-up visits as told by your health care provider. This is important. This information is not intended to replace advice given to you by your health care provider. Make sure you discuss any questions you have with your health care provider. Document Released: 02/18/2005 Document Revised: 11/18/2017 Document Reviewed: 11/18/2017 Elsevier Interactive Patient Education  2019 [employer].

## 2018-10-05 NOTE — Progress Notes (Signed)
This visit type was conducted due to national recommendations for restrictions regarding the COVID-19 pandemic (e.g. social distancing).  This format is felt to be most appropriate for this patient at this time.  All issues noted in this document were discussed and addressed.  No physical exam was performed (except for noted visual exam findings with Video Visits). Virtual Visit via Video Note  I connected with@  on 10/05/18 at  4:00 PM EDT by a video enabled telemedicine application and verified that I am speaking with the correct person using two identifiers.  Location patient: home Location provider:work  Persons participating in the virtual visit: patient, provider  I discussed the limitations of evaluation and management by telemedicine and the availability of in person appointments. The patient expressed understanding and agreed to proceed.   HPI:  CC: dysuria 4-5 days, waxes and wanes.   No hematuria, n, v, fever, abominal pain, flank pain, vaginal discharge.   No recurrent urinary tract infection.   No h/o kidney stone.   H/o htn, CAD.   ROS: See pertinent positives and negatives per HPI.  Past Medical History:  Diagnosis Date  . Allergy   . Cataract cortical, senile   . Celiac disease   . Chicken pox   . Collagenous colitis   . Degenerative joint disease   . Diverticulosis   . Family history of adverse reaction to anesthesia    daughter climbs the walls with anesthesia  . GERD (gastroesophageal reflux disease)   . Hypertension   . Hypothyroidism   . Restless leg     Past Surgical History:  Procedure Laterality Date  . ABDOMINAL HYSTERECTOMY  1971  . APPENDECTOMY  1958  . CATARACT EXTRACTION W/ INTRAOCULAR LENS IMPLANT Bilateral 2010   left done April and right done in May  . CHOLECYSTECTOMY  1979  . COLONOSCOPY WITH PROPOFOL N/A 01/21/2016   Procedure: COLONOSCOPY WITH PROPOFOL;  Surgeon: Lollie Sails, MD;  Location: [facility] ENDOSCOPY;  Service:  Endoscopy;  Laterality: N/A;  . KNEE ARTHROPLASTY Left 04/14/2017   Procedure: COMPUTER ASSISTED TOTAL KNEE ARTHROPLASTY;  Surgeon: Dereck Leep, MD;  Location: [facility] ORS;  Service: Orthopedics;  Laterality: Left;  . KNEE ARTHROSCOPY Left 02/17/2016   Procedure: ARTHROSCOPY KNEE Chondraplasty;  Surgeon: Dereck Leep, MD;  Location: [facility] ORS;  Service: Orthopedics;  Laterality: Left;  . ROTATOR CUFF REPAIR Left 2008  . TONSILLECTOMY      Family History  Problem Relation Age of Onset  . Breast cancer Sister   . Heart disease Mother   . Diabetes Mother   . Lupus Mother   . Arthritis Father   . Heart disease Father   . Aneurysm Father     SOCIAL HX: never smoker   Current Outpatient Medications:  .  amLODipine (NORVASC) 5 MG tablet, Take 5 mg by mouth daily., Disp: , Rfl:  .  aspirin EC 81 MG tablet, Take 81 mg 2 (two) times daily by mouth. , Disp: , Rfl:  .  cyclobenzaprine (FLEXERIL) 5 MG tablet, Take 1 tablet (5 mg total) by mouth 3 (three) times daily as needed for muscle spasms., Disp: 30 tablet, Rfl: 1 .  Ergocalciferol 50 MCG (2000 UT) TABS, Take 1 tablet by mouth daily., Disp: 90 tablet, Rfl: 1 .  Fe Fum-FePoly-Vit C-Vit B3 (INTEGRA PO), Take by mouth., Disp: , Rfl:  .  Ferrous Gluconate (IRON 27 PO), Take by mouth., Disp: , Rfl:  .  ferrous sulfate 325 (65 FE) MG tablet,  Take 325 mg by mouth daily with breakfast., Disp: , Rfl:  .  levothyroxine (SYNTHROID, LEVOTHROID) 125 MCG tablet, Take 1 tablet (125 mcg total) by mouth daily with breakfast., Disp: 90 tablet, Rfl: 3 .  levothyroxine (SYNTHROID, LEVOTHROID) 125 MCG tablet, TAKE ONE TABLET BY MOUTH EVERY MORNING 30 MINUTES BEFORE BREAKFAST, Disp: 90 tablet, Rfl: 1 .  meclizine (ANTIVERT) 12.5 MG tablet, Take 12.5 mg by mouth 3 (three) times daily as needed for dizziness., Disp: , Rfl:  .  metoprolol succinate (TOPROL-XL) 25 MG 24 hr tablet, Take 25 mg daily by mouth., Disp: , Rfl: 11 .  montelukast (SINGULAIR) 10 MG tablet,  Take 1 tablet (10 mg total) by mouth every morning., Disp: 90 tablet, Rfl: 1 .  nadolol (CORGARD) 40 MG tablet, Take 40 mg by mouth daily., Disp: , Rfl:  .  pantoprazole (PROTONIX) 40 MG tablet, Take 1 tablet (40 mg total) by mouth daily. (Patient taking differently: Take 40 mg 2 (two) times daily by mouth. ), Disp: 90 tablet, Rfl: 3 .  potassium chloride (K-DUR) 10 MEQ tablet, Take 2 tablets (20 mEq total) by mouth daily. (Patient taking differently: Take 30 mEq daily by mouth. ), Disp: 180 tablet, Rfl: 3 .  rOPINIRole (REQUIP) 0.5 MG tablet, Take 0.5 mg 2 (two) times daily by mouth. Take one tablet in the afternoon and two tablets at bedtime, Disp: , Rfl:  .  vitamin B-12 (CYANOCOBALAMIN) 1000 MCG tablet, Take 1,000 mcg by mouth daily., Disp: , Rfl:  .  fluticasone (FLONASE) 50 MCG/ACT nasal spray, Place 2 sprays into both nostrils daily. (Patient taking differently: Place 1 spray daily into both nostrils. ), Disp: 16 g, Rfl: 3 .  nitrofurantoin, macrocrystal-monohydrate, (MACROBID) 100 MG capsule, Take 1 capsule (100 mg total) by mouth 2 (two) times daily. Take with food., Disp: 10 capsule, Rfl: 0  EXAM:  VITALS per patient if applicable:  GENERAL: alert, oriented, appears well and in no acute distress  HEENT: atraumatic, conjunttiva clear, no obvious abnormalities on inspection of external nose and ears  NECK: normal movements of the head and neck  LUNGS: on inspection no signs of respiratory distress, breathing rate appears normal, no obvious gross SOB, gasping or wheezing  CV: no obvious cyanosis  MS: moves all visible extremities without noticeable abnormality  PSYCH/NEURO: pleasant and cooperative, no obvious depression or anxiety, speech and thought processing grossly intact  ASSESSMENT AND PLAN:  Discussed the following assessment and plan:  Dysuria - Plan: nitrofurantoin, macrocrystal-monohydrate, (MACROBID) 100 MG capsule  Problem List Items Addressed This Visit       Other   Dysuria - Primary    Patient well-appearing, nontoxic in appearance.  Will go ahead and treat empirically for UTI.  Pending urine culture, urine analysis at this time.  Patient will let me know how she is doing      Relevant Medications   nitrofurantoin, macrocrystal-monohydrate, (MACROBID) 100 MG capsule        I discussed the assessment and treatment plan with the patient. The patient was provided an opportunity to ask questions and all were answered. The patient agreed with the plan and demonstrated an understanding of the instructions.   The patient was advised to call back or seek an in-person evaluation if the symptoms worsen or if the condition fails to improve as anticipated.   Deborah Paris, FNP

## 2018-10-05 NOTE — Telephone Encounter (Signed)
Pt dropped off urine specimen. Please place future orders. I see that patient has appt today at 4pm

## 2018-10-06 LAB — URINALYSIS, ROUTINE W REFLEX MICROSCOPIC
Bilirubin Urine: NEGATIVE
Glucose, UA: NEGATIVE
Hgb urine dipstick: NEGATIVE
Leukocytes,Ua: NEGATIVE
Nitrite: NEGATIVE
Protein, ur: NEGATIVE
Specific Gravity, Urine: 1.026 (ref 1.001–1.03)
pH: <=5 (ref 5.0–8.0)

## 2018-10-06 LAB — URINE CULTURE
MICRO NUMBER:: 470740
SPECIMEN QUALITY:: ADEQUATE

## 2018-10-06 NOTE — Telephone Encounter (Signed)
Did not see this message until this morning. The POCT wasn't ordered "future" so I did not see it. I apologize for this.

## 2018-10-10 ENCOUNTER — Encounter: Payer: Self-pay | Admitting: Family

## 2018-10-10 DIAGNOSIS — R300 Dysuria: Secondary | ICD-10-CM | POA: Insufficient documentation

## 2018-10-10 NOTE — Assessment & Plan Note (Signed)
Patient well-appearing, nontoxic in appearance.  Will go ahead and treat empirically for UTI.  Pending urine culture, urine analysis at this time.  Patient will let me know how she is doing

## 2018-11-28 ENCOUNTER — Telehealth: Payer: Self-pay | Admitting: Family Medicine

## 2018-11-28 DIAGNOSIS — G2581 Restless legs syndrome: Secondary | ICD-10-CM

## 2018-11-28 DIAGNOSIS — J302 Other seasonal allergic rhinitis: Secondary | ICD-10-CM

## 2018-11-28 DIAGNOSIS — K219 Gastro-esophageal reflux disease without esophagitis: Secondary | ICD-10-CM

## 2018-11-28 MED ORDER — PANTOPRAZOLE SODIUM 40 MG PO TBEC: 40.0000 mg | DELAYED_RELEASE_TABLET | Freq: Every day | ORAL | 3 refills | Status: AC

## 2018-11-28 MED ORDER — ROPINIROLE HCL 0.5 MG PO TABS: ORAL_TABLET | ORAL | 3 refills | Status: DC

## 2018-11-28 NOTE — Telephone Encounter (Signed)
Renee with [facility] request 90 day supply for the following:   Medication: montelukast (SINGULAIR) 10 MG tablet, pantoprazole (PROTONIX) 40 MG tablet, and, rOPINIRole (REQUIP) 0.5 MG tablet  Has the patient contacted their pharmacy? yes   Preferred Pharmacy (with phone number or street name):  [place], [place] - [place] [phone] (Phone) [phone] (Fax)   Agent: Please be advised that RX refills may take up to 3 business days. We ask that you follow-up with your pharmacy.

## 2018-11-28 NOTE — Telephone Encounter (Signed)
These prescriptions were written by historical providers.

## 2018-11-28 NOTE — Telephone Encounter (Signed)
Rx sent to pharmacy   

## 2018-12-07 ENCOUNTER — Telehealth: Payer: Self-pay | Admitting: Family Medicine

## 2018-12-07 NOTE — Telephone Encounter (Signed)
Forwarding to PCP.

## 2018-12-07 NOTE — Telephone Encounter (Signed)
Pt was seen today By [facility] and she reported that the Pt had an abnormal [place] and shows moderate peripheral artery disease / please follow up with Pt / please advise

## 2018-12-08 NOTE — Telephone Encounter (Signed)
Please call patient to see if she would like to see vascular specialist in regards to these findings of peripheral artery disease.  Continue aspirin 81 mg daily  If agrees to referral I will put in  Thanks  LG

## 2018-12-08 NOTE — Telephone Encounter (Signed)
Sounds good, thanks.

## 2018-12-08 NOTE — Telephone Encounter (Signed)
Called Pt with results she stated she understood. Pt also stated she wants to wait until she has her appt on 12/16/18 with NP Philis Nettle before she see a specialist

## 2018-12-16 ENCOUNTER — Ambulatory Visit: Payer: PPO | Admitting: Family Medicine

## 2019-01-06 ENCOUNTER — Other Ambulatory Visit: Payer: Self-pay

## 2019-01-09 ENCOUNTER — Encounter: Payer: Self-pay | Admitting: Family Medicine

## 2019-01-09 ENCOUNTER — Other Ambulatory Visit: Payer: Self-pay

## 2019-01-09 ENCOUNTER — Ambulatory Visit (INDEPENDENT_AMBULATORY_CARE_PROVIDER_SITE_OTHER): Payer: PPO | Admitting: Family Medicine

## 2019-01-09 VITALS — BP 102/64 | HR 68 | Temp 97.7°F | Resp 16 | Ht 65.0 in | Wt 171.8 lb

## 2019-01-09 DIAGNOSIS — I2510 Atherosclerotic heart disease of native coronary artery without angina pectoris: Secondary | ICD-10-CM

## 2019-01-09 DIAGNOSIS — I10 Essential (primary) hypertension: Secondary | ICD-10-CM

## 2019-01-09 DIAGNOSIS — I739 Peripheral vascular disease, unspecified: Secondary | ICD-10-CM | POA: Insufficient documentation

## 2019-01-09 DIAGNOSIS — M25512 Pain in left shoulder: Secondary | ICD-10-CM

## 2019-01-09 DIAGNOSIS — I351 Nonrheumatic aortic (valve) insufficiency: Secondary | ICD-10-CM

## 2019-01-09 NOTE — Patient Instructions (Signed)
Hold each exercise for 10 seconds. Do each exercise in set of 5 or set of 10    Shoulder Exercises Ask your health care provider which exercises are safe for you. Do exercises exactly as told by your health care provider and adjust them as directed. It is normal to feel mild stretching, pulling, tightness, or discomfort as you do these exercises. Stop right away if you feel sudden pain or your pain gets worse. Do not begin these exercises until told by your health care provider. Stretching exercises External rotation and abduction This exercise is sometimes called corner stretch. This exercise rotates your arm outward (external rotation) and moves your arm out from your body (abduction). 1. Stand in a doorway with one of your feet slightly in front of the other. This is called a staggered stance. If you cannot reach your forearms to the door frame, stand facing a corner of a room. 2. Choose one of the following positions as told by your health care provider: ? Place your hands and forearms on the door frame above your head. ? Place your hands and forearms on the door frame at the height of your head. ? Place your hands on the door frame at the height of your elbows. 3. Slowly move your weight onto your front foot until you feel a stretch across your chest and in the front of your shoulders. Keep your head and chest upright and keep your abdominal muscles tight. 4. Hold for __________ seconds. 5. To release the stretch, shift your weight to your back foot. Repeat __________ times. Complete this exercise __________ times a day. Extension, standing 1. Stand and hold a broomstick, a cane, or a similar object behind your back. ? Your hands should be a little wider than shoulder width apart. ? Your palms should face away from your back. 2. Keeping your elbows straight and your shoulder muscles relaxed, move the stick away from your body until you feel a stretch in your shoulders (extension). ?  Avoid shrugging your shoulders while you move the stick. Keep your shoulder blades tucked down toward the middle of your back. 3. Hold for __________ seconds. 4. Slowly return to the starting position. Repeat __________ times. Complete this exercise __________ times a day. Range-of-motion exercises Pendulum  1. Stand near a wall or a surface that you can hold onto for balance. 2. Bend at the waist and let your left / right arm hang straight down. Use your other arm to support you. Keep your back straight and do not lock your knees. 3. Relax your left / right arm and shoulder muscles, and move your hips and your trunk so your left / right arm swings freely. Your arm should swing because of the motion of your body, not because you are using your arm or shoulder muscles. 4. Keep moving your hips and trunk so your arm swings in the following directions, as told by your health care provider: ? Side to side. ? Forward and backward. ? In clockwise and counterclockwise circles. 5. Continue each motion for __________ seconds, or for as long as told by your health care provider. 6. Slowly return to the starting position. Repeat __________ times. Complete this exercise __________ times a day. Shoulder flexion, standing  1. Stand and hold a broomstick, a cane, or a similar object. Place your hands a little more than shoulder width apart on the object. Your left / right hand should be palm up, and your other hand should be palm  down. 2. Keep your elbow straight and your shoulder muscles relaxed. Push the stick up with your healthy arm to raise your left / right arm in front of your body, and then over your head until you feel a stretch in your shoulder (flexion). ? Avoid shrugging your shoulder while you raise your arm. Keep your shoulder blade tucked down toward the middle of your back. 3. Hold for __________ seconds. 4. Slowly return to the starting position. Repeat __________ times. Complete this  exercise __________ times a day. Shoulder abduction, standing 1. Stand and hold a broomstick, a cane, or a similar object. Place your hands a little more than shoulder width apart on the object. Your left / right hand should be palm up, and your other hand should be palm down. 2. Keep your elbow straight and your shoulder muscles relaxed. Push the object across your body toward your left / right side. Raise your left / right arm to the side of your body (abduction) until you feel a stretch in your shoulder. ? Do not raise your arm above shoulder height unless your health care provider tells you to do that. ? If directed, raise your arm over your head. ? Avoid shrugging your shoulder while you raise your arm. Keep your shoulder blade tucked down toward the middle of your back. 3. Hold for __________ seconds. 4. Slowly return to the starting position. Repeat __________ times. Complete this exercise __________ times a day. Internal rotation  1. Place your left / right hand behind your back, palm up. 2. Use your other hand to dangle an exercise band, a towel, or a similar object over your shoulder. Grasp the band with your left / right hand so you are holding on to both ends. 3. Gently pull up on the band until you feel a stretch in the front of your left / right shoulder. The movement of your arm toward the center of your body is called internal rotation. ? Avoid shrugging your shoulder while you raise your arm. Keep your shoulder blade tucked down toward the middle of your back. 4. Hold for __________ seconds. 5. Release the stretch by letting go of the band and lowering your hands. Repeat __________ times. Complete this exercise __________ times a day. Strengthening exercises External rotation  1. Sit in a stable chair without armrests. 2. Secure an exercise band to a stable object at elbow height on your left / right side. 3. Place a soft object, such as a folded towel or a small pillow,  between your left / right upper arm and your body to move your elbow about 4 inches (10 cm) away from your side. 4. Hold the end of the exercise band so it is tight and there is no slack. 5. Keeping your elbow pressed against the soft object, slowly move your forearm out, away from your abdomen (external rotation). Keep your body steady so only your forearm moves. 6. Hold for __________ seconds. 7. Slowly return to the starting position. Repeat __________ times. Complete this exercise __________ times a day. Shoulder abduction  1. Sit in a stable chair without armrests, or stand up. 2. Hold a __________ weight in your left / right hand, or hold an exercise band with both hands. 3. Start with your arms straight down and your left / right palm facing in, toward your body. 4. Slowly lift your left / right hand out to your side (abduction). Do not lift your hand above shoulder height unless your health  care provider tells you that this is safe. ? Keep your arms straight. ? Avoid shrugging your shoulder while you do this movement. Keep your shoulder blade tucked down toward the middle of your back. 5. Hold for __________ seconds. 6. Slowly lower your arm, and return to the starting position. Repeat __________ times. Complete this exercise __________ times a day. Shoulder extension 1. Sit in a stable chair without armrests, or stand up. 2. Secure an exercise band to a stable object in front of you so it is at shoulder height. 3. Hold one end of the exercise band in each hand. Your palms should face each other. 4. Straighten your elbows and lift your hands up to shoulder height. 5. Step back, away from the secured end of the exercise band, until the band is tight and there is no slack. 6. Squeeze your shoulder blades together as you pull your hands down to the sides of your thighs (extension). Stop when your hands are straight down by your sides. Do not let your hands go behind your body. 7. Hold  for __________ seconds. 8. Slowly return to the starting position. Repeat __________ times. Complete this exercise __________ times a day. Shoulder row 1. Sit in a stable chair without armrests, or stand up. 2. Secure an exercise band to a stable object in front of you so it is at waist height. 3. Hold one end of the exercise band in each hand. Position your palms so that your thumbs are facing the ceiling (neutral position). 4. Bend each of your elbows to a 90-degree angle (right angle) and keep your upper arms at your sides. 5. Step back until the band is tight and there is no slack. 6. Slowly pull your elbows back behind you. 7. Hold for __________ seconds. 8. Slowly return to the starting position. Repeat __________ times. Complete this exercise __________ times a day. Shoulder press-ups  1. Sit in a stable chair that has armrests. Sit upright, with your feet flat on the floor. 2. Put your hands on the armrests so your elbows are bent and your fingers are pointing forward. Your hands should be about even with the sides of your body. 3. Push down on the armrests and use your arms to lift yourself off the chair. Straighten your elbows and lift yourself up as much as you comfortably can. ? Move your shoulder blades down, and avoid letting your shoulders move up toward your ears. ? Keep your feet on the ground. As you get stronger, your feet should support less of your body weight as you lift yourself up. 4. Hold for __________ seconds. 5. Slowly lower yourself back into the chair. Repeat __________ times. Complete this exercise __________ times a day. Wall push-ups  1. Stand so you are facing a stable wall. Your feet should be about one arm-length away from the wall. 2. Lean forward and place your palms on the wall at shoulder height. 3. Keep your feet flat on the floor as you bend your elbows and lean forward toward the wall. 4. Hold for __________ seconds. 5. Straighten your elbows to  push yourself back to the starting position. Repeat __________ times. Complete this exercise __________ times a day. This information is not intended to replace advice given to you by your health care provider. Make sure you discuss any questions you have with your health care provider. Document Released: 03/25/2005 Document Revised: 09/02/2018 Document Reviewed: 06/10/2018 Elsevier Patient Education  2020 [employer].

## 2019-01-09 NOTE — Progress Notes (Signed)
Subjective:    Patient ID: Deborah Deleon, female    DOB: [birth year], 84 y.o.   MRN: [id number]  HPI   Patient presents to clinic for follow-up on blood pressure, CAD, chronic lower extremity swelling and to discussed test done by housecall NP that revealed poor arterial flow.  Patient has been tolerating all her medications without any adverse effects.  Blood pressure usually runs in the 100-120/60-70 mark.  Denies chest pain, shortness of breath, palpitations.  Does have some lower extremity swelling at times, but this usually responds to her daily Lasix, elevating legs and when she wears compression stockings.  Patient does follow regularly with cardiology.  Patient also reports some left shoulder pain for about 6 to 8 weeks.  States she did slip in the shower and landed with her left shoulder up against the side of the shower wall about 2 months ago.  Did not fall to the ground or hurt anything else, but thinks that hitting her shoulder on the wall did cause some bruising and soreness.  She has been doing gentle stretching and range of motion, but still has pain in shoulder area.   Patient Active Problem List   Diagnosis Date Noted  . Dysuria 10/10/2018  . S/P total knee arthroplasty 04/14/2017  . Primary osteoarthritis of left knee 04/04/2017  . OSA on CPAP 03/22/2017  . Other and unspecified hyperlipidemia 02/18/2017  . Venous stasis 07/10/2016  . CAD (coronary artery disease) 06/18/2016  . Aortic insufficiency 06/18/2016  . Mitral regurgitation 06/18/2016  . Essential hypertension 12/22/2015  . Collagenous colitis 12/22/2015  . Celiac disease 12/22/2015  . Hypothyroidism 01/09/2015  . Restless leg syndrome 01/09/2015  . Pulmonary nodule 11/27/2013  . GERD (gastroesophageal reflux disease) 11/13/2013   Social History   Tobacco Use  . Smoking status: Never Smoker  . Smokeless tobacco: Never Used  Substance Use Topics  . Alcohol use: No   Review of Systems   Constitutional: Negative for chills, fatigue and fever.  HENT: Negative for congestion, ear pain, sinus pain and sore throat.   Eyes: Negative.   Respiratory: Negative for cough, shortness of breath and wheezing.   Cardiovascular: Negative for chest pain, palpitations and leg swelling.  Gastrointestinal: Negative for abdominal pain, diarrhea, nausea and vomiting.  Genitourinary: Negative for dysuria, frequency and urgency.  Musculoskeletal: +left shoulder pain.  Skin: Negative for color change, pallor and rash.  Neurological: Negative for syncope, light-headedness and headaches.  Psychiatric/Behavioral: The patient is not nervous/anxious.       Objective:   Physical Exam Vitals signs and nursing note reviewed.  Constitutional:      General: She is not in acute distress.    Appearance: She is not toxic-appearing.  HENT:     Head: Normocephalic and atraumatic.  Eyes:     General: No scleral icterus.    Extraocular Movements: Extraocular movements intact.     Conjunctiva/sclera: Conjunctivae normal.     Pupils: Pupils are equal, round, and reactive to light.  Cardiovascular:     Rate and Rhythm: Normal rate and regular rhythm.     Heart sounds: Normal heart sounds.  Pulmonary:     Effort: Pulmonary effort is normal. No respiratory distress.     Breath sounds: Normal breath sounds.  Musculoskeletal:     Right lower leg: No edema.     Left lower leg: No edema.     Comments: Able to lift both arms straight up above head, when lifting left arm up  above head this motion does cause some soreness when getting to extreme limits of range.  Grips equal and strong, able to put both arms straight out to the side and straight up front of her and resist my pushing down.  Skin:    General: Skin is warm and dry.     Capillary Refill: Capillary refill takes less than 2 seconds.  Neurological:     General: No focal deficit present.     Mental Status: She is alert and oriented to person, place,  and time.  Psychiatric:        Mood and Affect: Mood normal.        Behavior: Behavior normal.        Thought Content: Thought content normal.        Judgment: Judgment normal.     Today's Vitals   01/09/19 0959  BP: 102/64  Pulse: 68  Resp: 16  Temp: 97.7 F (36.5 C)  TempSrc: Temporal  SpO2: 96%  Weight: 171 lb 12.8 oz (77.9 kg)  Height: 5\' 5"  (1.651 m)   Body mass index is 28.59 kg/m.    Assessment & Plan:    A total of 25  minutes were spent face-to-face with the patient during this encounter and over half of that time was spent on counseling and coordination of care. The patient was counseled on medications and why she has leg swelling and what rests of quantaflo testing mean. Shoulder exercises discussed.   Aortic insufficiency, CAD, hypertension - patient's blood pressure is stable on current medication and tolerating it without any problems.  PAD -- Disease as revealed by Quanta-flow testing done by visiting NP these her annually through her insurance she will continue her Lasix daily and we will refer to vascular surgery for further dilation management.  She will also continue Lasix as prescribed and elevate legs whenever able about twice a day.    Left shoulder pain - patient advised that she can use Tylenol as needed for pain control and to do gentle stretching and range of motion exercises.  Patient given handout outlining exercises she can do to help strengthen shoulder.  Also offered physical therapy referral, declines at this time but will let me know if she changes mind and wants to see physical therapist.  Also suggested topical rubs such as BenGay or Biofreeze for sore shoulder  Patient will follow-up in 3 months for recheck on chronic medical conditions.  Overall she is doing well and feeling well.

## 2019-01-18 ENCOUNTER — Other Ambulatory Visit: Payer: Self-pay

## 2019-01-18 ENCOUNTER — Ambulatory Visit (INDEPENDENT_AMBULATORY_CARE_PROVIDER_SITE_OTHER): Payer: PPO

## 2019-01-18 DIAGNOSIS — Z0000 Encounter for general adult medical examination without abnormal findings: Secondary | ICD-10-CM

## 2019-01-18 NOTE — Patient Instructions (Addendum)
  Ms. Deborah Deleon , Thank you for taking time to come for your Medicare Wellness Visit. I appreciate your ongoing commitment to your health goals. Please review the following plan we discussed and let me know if I can assist you in the future.   These are the goals we discussed: Goals      Patient Stated   . Follow up with Primary Care Provider (pt-stated)     As needed       This is a list of the screening recommended for you and due dates:  Health Maintenance  Topic Date Due  . DEXA scan (bone density measurement)  11/20/2001  . Flu Shot  08/23/2019*  . Tetanus Vaccine  05/25/2020  . Pneumonia vaccines  Completed  *Topic was postponed. The date shown is not the original due date.

## 2019-01-18 NOTE — Progress Notes (Signed)
Subjective:   Deborah Deleon is a 84 y.o. female who presents for an Initial Medicare Annual Wellness Visit.  Review of Systems    No ROS.  Medicare Wellness Virtual Visit.  Visual/audio telehealth visit, UTA vital signs.   See social history for additional risk factors.    Cardiac Risk Factors include: advanced age (>56men, >59 women);hypertension     Objective:    Today's Vitals   There is no height or weight on file to calculate BMI.  Advanced Directives 01/18/2019 05/17/2017 04/14/2017 04/14/2017 04/07/2017 02/17/2016 02/11/2016  Does Patient Have a Medical Advance Directive? Yes No No No No - Yes  Type of Advance Directive Living will;Healthcare Power of [place]  Does patient want to make changes to medical advance directive? No - Patient declined - - - - No - Patient declined No - Patient declined  Copy of [place] in Chart? No - copy requested - - - - No - copy requested No - copy requested  Would patient like information on creating a medical advance directive? - No - Patient declined Yes (Inpatient - patient requests chaplain consult to create a medical advance directive) No - Patient declined No - Patient declined - -    Current Medications (verified) Outpatient Encounter Medications as of 01/18/2019  Medication Sig  . amLODipine (NORVASC) 5 MG tablet Take 5 mg by mouth daily.  Marland Kitchen aspirin EC 81 MG tablet Take 81 mg 2 (two) times daily by mouth.   . cyclobenzaprine (FLEXERIL) 5 MG tablet Take 1 tablet (5 mg total) by mouth 3 (three) times daily as needed for muscle spasms.  . Ergocalciferol 50 MCG (2000 UT) TABS Take 1 tablet by mouth daily.  . Fe Fum-FePoly-Vit C-Vit B3 (INTEGRA PO) Take by mouth.  . Ferrous Gluconate (IRON 27 PO) Take by mouth.  . ferrous sulfate 325 (65 FE) MG tablet Take 325 mg by mouth daily with breakfast.  . levothyroxine (SYNTHROID, LEVOTHROID) 125 MCG tablet  Take 1 tablet (125 mcg total) by mouth daily with breakfast.  . levothyroxine (SYNTHROID, LEVOTHROID) 125 MCG tablet TAKE ONE TABLET BY MOUTH EVERY MORNING 30 MINUTES BEFORE BREAKFAST  . meclizine (ANTIVERT) 12.5 MG tablet Take 12.5 mg by mouth 3 (three) times daily as needed for dizziness.  . metoprolol succinate (TOPROL-XL) 25 MG 24 hr tablet Take 25 mg daily by mouth.  . montelukast (SINGULAIR) 10 MG tablet Take 1 tablet (10 mg total) by mouth every morning.  . nadolol (CORGARD) 40 MG tablet Take 40 mg by mouth daily.  . nitrofurantoin, macrocrystal-monohydrate, (MACROBID) 100 MG capsule Take 1 capsule (100 mg total) by mouth 2 (two) times daily. Take with food.  . pantoprazole (PROTONIX) 40 MG tablet Take 1 tablet (40 mg total) by mouth daily.  . potassium chloride (K-DUR) 10 MEQ tablet Take 2 tablets (20 mEq total) by mouth daily. (Patient taking differently: Take 30 mEq daily by mouth. )  . rOPINIRole (REQUIP) 0.5 MG tablet Take one tablet in the afternoon and two tablets at bedtime  . vitamin B-12 (CYANOCOBALAMIN) 1000 MCG tablet Take 1,000 mcg by mouth daily.  . fluticasone (FLONASE) 50 MCG/ACT nasal spray Place 2 sprays into both nostrils daily. (Patient taking differently: Place 1 spray daily into both nostrils. )   No facility-administered encounter medications on file as of 01/18/2019.     Allergies (verified) Penicillins and Sulfa antibiotics   History: Past Medical  History:  Diagnosis Date  . Allergy   . Cataract cortical, senile   . Celiac disease   . Chicken pox   . Collagenous colitis   . Degenerative joint disease   . Diverticulosis   . Family history of adverse reaction to anesthesia    daughter climbs the walls with anesthesia  . GERD (gastroesophageal reflux disease)   . Hypertension   . Hypothyroidism   . Restless leg    Past Surgical History:  Procedure Laterality Date  . ABDOMINAL HYSTERECTOMY  1971  . APPENDECTOMY  1958  . CATARACT EXTRACTION W/  INTRAOCULAR LENS IMPLANT Bilateral 2010   left done April and right done in May  . CHOLECYSTECTOMY  1979  . COLONOSCOPY WITH PROPOFOL N/A 01/21/2016   Procedure: COLONOSCOPY WITH PROPOFOL;  Surgeon: Lollie Sails, MD;  Location: [facility] ENDOSCOPY;  Service: Endoscopy;  Laterality: N/A;  . KNEE ARTHROPLASTY Left 04/14/2017   Procedure: COMPUTER ASSISTED TOTAL KNEE ARTHROPLASTY;  Surgeon: Dereck Leep, MD;  Location: [facility] ORS;  Service: Orthopedics;  Laterality: Left;  . KNEE ARTHROSCOPY Left 02/17/2016   Procedure: ARTHROSCOPY KNEE Chondraplasty;  Surgeon: Dereck Leep, MD;  Location: [facility] ORS;  Service: Orthopedics;  Laterality: Left;  . ROTATOR CUFF REPAIR Left 2008  . TONSILLECTOMY     Family History  Problem Relation Age of Onset  . Breast cancer Sister   . Heart disease Mother   . Diabetes Mother   . Lupus Mother   . Arthritis Father   . Heart disease Father   . Aneurysm Father    Social History   Socioeconomic History  . Marital status: Widowed    Spouse name: Not on file  . Number of children: Not on file  . Years of education: Not on file  . Highest education level: Not on file  Occupational History  . Not on file  Social Needs  . Financial resource strain: Not hard at all  . Food insecurity    Worry: Never true    Inability: Never true  . Transportation needs    Medical: No    Non-medical: No  Tobacco Use  . Smoking status: Never Smoker  . Smokeless tobacco: Never Used  Substance and Sexual Activity  . Alcohol use: No  . Drug use: No  . Sexual activity: Not on file  Lifestyle  . Physical activity    Days per week: 3 days    Minutes per session: 30 min  . Stress: Not at all  Relationships  . Social Herbalist on phone: Not on file    Gets together: Not on file    Attends religious service: Not on file    Active member of club or organization: Not on file    Attends meetings of clubs or organizations: Not on file    Relationship status:  Not on file  Other Topics Concern  . Not on file  Social History Narrative  . Not on file    Tobacco Counseling Counseling given: Not Answered   Clinical Intake:  Pre-visit preparation completed: Yes        Diabetes: No  How often do you need to have someone help you when you read instructions, pamphlets, or other written materials from your doctor or pharmacy?: 1 - Never  Interpreter Needed?: No      Activities of Daily Living In your present state of health, do you have any difficulty performing the following activities: 01/18/2019  Hearing? N  Vision? N  Difficulty concentrating or making decisions? N  Walking or climbing stairs? N  Dressing or bathing? N  Doing errands, shopping? N  Preparing Food and eating ? N  Using the Toilet? N  In the past six months, have you accidently leaked urine? N  Do you have problems with loss of bowel control? N  Managing your Medications? N  Managing your Finances? N  Housekeeping or managing your Housekeeping? N  Some recent data might be hidden     Immunizations and Health Maintenance Immunization History  Administered Date(s) Administered  . Influenza-Unspecified 03/01/2013, 02/28/2014, 02/27/2015, 02/27/2016, 02/22/2017  . Pneumococcal Conjugate-13 08/19/2017  . Pneumococcal Polysaccharide-23 01/08/2013, 08/19/2017  . Zoster Recombinat (Shingrix) 12/30/2017, 03/04/2018   Health Maintenance Due  Topic Date Due  . DEXA SCAN  11/20/2001    Patient Care Team: Jodelle Green, FNP as PCP - General (Family Medicine)  Indicate any recent Medical Services you may have received from other than [facility] providers in the past year (date may be approximate).     Assessment:   This is a routine wellness examination for [place].  I connected with patient 01/18/19 at  8:30 AM EDT by a video/audio enabled telemedicine application and verified that I am speaking with the correct person using two identifiers. Patient stated full  name and DOB. Patient gave permission to continue with virtual visit. Patient's location was at home and Nurse's location was at [place] office.   Health Maintenance Due: Influenza vaccine 2020- discussed; to be completed in season with doctor or local pharmacy.   Dexa Scan- discussed; deferred for follow up with pcp per patient preference.  Update all pending maintenance due as appropriate.   See completed HM at the end of note.   Eye: Visual acuity not assessed. Virtual visit. Wears reading glasses. Followed by their ophthalmologist every 12 months.   Dental: Visits every 6 months.    Hearing: Demonstrates normal hearing during visit.  Safety:  Patient feels safe at home- yes Patient does have smoke detectors at home- yes Patient does wear sunscreen or protective clothing when in direct sunlight - yes Patient does wear seat belt when in a moving vehicle - yes Patient drives- yes Adequate lighting in walkways free from debris- yes Grab bars and handrails used as appropriate- yes Ambulates with no assistive device Cell phone on person when ambulating outside of the home- yes  Social: Alcohol intake - no   Smoking history- never   Smokers in home? none Illicit drug use? none  Depression: PHQ 2 &9 complete. See screening below. Denies irritability, anhedonia, sadness/tearfullness.  Stable.   Falls: See screening below.    Medication: Taking as directed and without issues.   Covid-19: Precautions and sickness symptoms discussed. Wears mask, social distancing, hand hygiene as appropriate.   Activities of Daily Living Patient denies needing assistance with: household chores, feeding themselves, getting from bed to chair, getting to the toilet, bathing/showering, dressing, managing money, or preparing meals.   Memory: Patient is alert. Patient denies difficulty focusing or concentrating. Correctly identified the president of the [place], season and recall. Patient likes to  read, sew, and complete crafts for brain stimulation.  BMI- discussed the importance of a healthy diet, water intake and the benefits of aerobic exercise.  Educational material provided.  Physical activity- walking and aerobic exercise 3 days weekly, 30 minutes  Diet: Gluten Water: good intake  Advanced Directive: End of life planning; Advance aging; Advanced directives discussed.  Copy of  current HCPOA/Living Will requested.    Other Providers Patient Care Team: Jodelle Green, FNP as PCP - General (Family Medicine)  Hearing/Vision screen  Hearing Screening   125Hz  250Hz  500Hz  1000Hz  2000Hz  3000Hz  4000Hz  6000Hz  8000Hz   Right ear:           Left ear:           Comments: Patient is able to hear conversational tones without difficulty.  No issues reported.   Vision Screening Comments: Wears corrective lenses Cataract extraction, bilateral Visual acuity not assessed, virtual visit.  They have seen their ophthalmologist in the last 12 months.     Dietary issues and exercise activities discussed: Current Exercise Habits: Home exercise routine, Type of exercise: walking;calisthenics, Time (Minutes): 30, Frequency (Times/Week): 3, Weekly Exercise (Minutes/Week): 90, Intensity: Mild  Goals      Patient Stated   . Follow up with Primary Care Provider (pt-stated)     As needed      Depression Screen PHQ 2/9 Scores 01/18/2019 06/18/2016  PHQ - 2 Score 0 0    Fall Risk Fall Risk  01/18/2019 06/18/2016  Falls in the past year? 1 Yes  Number falls in past yr: 0 1  Injury with Fall? 1 No  Comment She hurt her shoulder; sought medical care. Followed by pcp. -  Follow up Falls prevention discussed;Education provided -   Timed Get Up and Go Performed no, virtual visit  Cognitive Function:     6CIT Screen 01/18/2019  What Year? 0 points  What month? 0 points  What time? 0 points  Count back from 20 0 points  Months in reverse 0 points  Repeat phrase 0 points  Total Score 0     Screening Tests Health Maintenance  Topic Date Due  . DEXA SCAN  11/20/2001  . INFLUENZA VACCINE  08/23/2019 (Originally 12/24/2018)  . TETANUS/TDAP  05/25/2020  . PNA vac Low Risk Adult  Completed      Plan:    Keep all routine maintenance appointments.   Follow up -04/13/19  Medicare Attestation I have personally reviewed: The patient's medical and social history Their use of alcohol, tobacco or illicit drugs Their current medications and supplements The patient's functional ability including ADLs,fall risks, home safety risks, cognitive, and hearing and visual impairment Diet and physical activities Evidence for depression   In addition, I have reviewed and discussed with patient certain preventive protocols, quality metrics, and best practice recommendations. A written personalized care plan for preventive services as well as general preventive health recommendations were provided to patient.     Varney Biles, LPN   [phone]

## 2019-01-23 ENCOUNTER — Ambulatory Visit: Payer: Self-pay

## 2019-01-23 ENCOUNTER — Other Ambulatory Visit: Payer: Self-pay

## 2019-01-23 ENCOUNTER — Ambulatory Visit (INDEPENDENT_AMBULATORY_CARE_PROVIDER_SITE_OTHER): Payer: PPO | Admitting: Family Medicine

## 2019-01-23 DIAGNOSIS — H8113 Benign paroxysmal vertigo, bilateral: Secondary | ICD-10-CM | POA: Diagnosis not present

## 2019-01-23 MED ORDER — MECLIZINE HCL 25 MG PO TABS: 25.0000 mg | ORAL_TABLET | Freq: Three times a day (TID) | ORAL | 2 refills | Status: DC | PRN

## 2019-01-23 NOTE — Progress Notes (Signed)
Note reviewed, agree with note  LGuse FNP

## 2019-01-23 NOTE — Telephone Encounter (Signed)
Pt called with C/O vertigo since yesterday.  She states that room is moving and tilting.  She has has this condition in the past and was given exercises for the condition by her ENT doctor.  She states that they work but when she stops the vertigo continues.  She feels unsteady when ambulating but has not fallen. She has no headache.  She has allergies and is taking allergy medications. Care advice read to patient. Patient verbalized understanding. Call transferred to office for scheduling.  Reason for Disposition . [1] MODERATE dizziness (e.g., vertigo; feels very unsteady, interferes with normal activities) AND [2] has NOT been evaluated by physician for this  Answer Assessment - Initial Assessment Questions 1. DESCRIPTION: "Describe your dizziness."     Room swaying 2. VERTIGO: "Do you feel like either you or the room is spinning or tilting?"      yes 3. LIGHTHEADED: "Do you feel lightheaded?" (e.g., somewhat faint, woozy, weak upon standing)     No 4. SEVERITY: "How bad is it?"  "Can you walk?"   - MILD - Feels unsteady but walking normally.   - MODERATE - Feels very unsteady when walking, but not falling; interferes with normal activities (e.g., school, work) .   - SEVERE - Unable to walk without falling (requires assistance).     Very unsteady 5. ONSET:  "When did the dizziness begin?"     yesterday 6. AGGRAVATING FACTORS: "Does anything make it worse?" (e.g., standing, change in head position)     sinus 7. CAUSE: "What do you think is causing the dizziness?"     vertigo 8. RECURRENT SYMPTOM: "Have you had dizziness before?" If so, ask: "When was the last time?" "What happened that time?"     Yes, 2 years 9. OTHER SYMPTOMS: "Do you have any other symptoms?" (e.g., headache, weakness, numbness, vomiting, earache)    none 10. PREGNANCY: "Is there any chance you are pregnant?" "When was your last menstrual period?"      N/A  Protocols used: DIZZINESS - VERTIGO-A-AH

## 2019-01-23 NOTE — Progress Notes (Signed)
Patient ID: HYDI BATCH, female   DOB: May 13, 1935, 84 y.o.   MRN: [id number]    Virtual Visit via video Note  This visit type was conducted due to national recommendations for restrictions regarding the COVID-19 pandemic (e.g. social distancing).  This format is felt to be most appropriate for this patient at this time.  All issues noted in this document were discussed and addressed.  No physical exam was performed (except for noted visual exam findings with Video Visits).   I connected with Deborah Deleon today at 10:20 AM EDT by a video enabled telemedicine application and verified that I am speaking with the correct person using two identifiers. Location patient: home Location provider: work or home office Persons participating in the virtual visit: patient, provider  I discussed the limitations, risks, security and privacy concerns of performing an evaluation and management service by video and the availability of in person appointments. I also discussed with the patient that there may be a patient responsible charge related to this service. The patient expressed understanding and agreed to proceed.   HPI:  Patient and I connected via video to discuss vertigo.  Patient states she has dealt with vertigo off and on for years, but is having a bad flareup today.  Took a dose of meclizine 12.5 mg this morning (has helped some since taking) and is diligent with taking her nasal spray and allergy medicines every day to help ward off vertigo symptoms.  States she feels dizzy when changing position from sitting to standing and having to walk, has to steady self by holding onto the wall.  Denies any vomiting or severe headache.  Denies any loss of visual field.  States this feels like a vertigo breakthrough that she has dealt with before.  Patient has had imaging of the brain and also follows regularly with ENT in regards to her vertigo issues.  States the ENT did teach her exercises to do to help  combat vertigo, she has been doing exercises vertigo with subside for short time, but then the symptoms slowly begin to come back.   ROS: See pertinent positives and negatives per HPI.  Past Medical History:  Diagnosis Date   Allergy    Cataract cortical, senile    Celiac disease    Chicken pox    Collagenous colitis    Degenerative joint disease    Diverticulosis    Family history of adverse reaction to anesthesia    daughter climbs the walls with anesthesia   GERD (gastroesophageal reflux disease)    Hypertension    Hypothyroidism    Restless leg     Past Surgical History:  Procedure Laterality Date   [place]   CATARACT EXTRACTION W/ INTRAOCULAR LENS IMPLANT Bilateral 2010   left done April and right done in May   CHOLECYSTECTOMY  1979   COLONOSCOPY WITH PROPOFOL N/A 01/21/2016   Procedure: COLONOSCOPY WITH PROPOFOL;  Surgeon: Lollie Sails, MD;  Location: [facility] ENDOSCOPY;  Service: Endoscopy;  Laterality: N/A;   KNEE ARTHROPLASTY Left 04/14/2017   Procedure: COMPUTER ASSISTED TOTAL KNEE ARTHROPLASTY;  Surgeon: Dereck Leep, MD;  Location: [facility] ORS;  Service: Orthopedics;  Laterality: Left;   KNEE ARTHROSCOPY Left 02/17/2016   Procedure: ARTHROSCOPY KNEE Chondraplasty;  Surgeon: Dereck Leep, MD;  Location: [facility] ORS;  Service: Orthopedics;  Laterality: Left;   ROTATOR CUFF REPAIR Left 2008   TONSILLECTOMY      Family History  Problem Relation Age of Onset   Breast cancer Sister    Heart disease Mother    Diabetes Mother    Lupus Mother    Arthritis Father    Heart disease Father    Aneurysm Father    Social History   Tobacco Use   Smoking status: Never Smoker   Smokeless tobacco: Never Used  Substance Use Topics   Alcohol use: No    Current Outpatient Medications:    amLODipine (NORVASC) 5 MG tablet, Take 5 mg by mouth daily., Disp: , Rfl:    aspirin EC 81 MG tablet, Take 81  mg 2 (two) times daily by mouth. , Disp: , Rfl:    cyclobenzaprine (FLEXERIL) 5 MG tablet, Take 1 tablet (5 mg total) by mouth 3 (three) times daily as needed for muscle spasms., Disp: 30 tablet, Rfl: 1   Ergocalciferol 50 MCG (2000 UT) TABS, Take 1 tablet by mouth daily., Disp: 90 tablet, Rfl: 1   Fe Fum-FePoly-Vit C-Vit B3 (INTEGRA PO), Take by mouth., Disp: , Rfl:    Ferrous Gluconate (IRON 27 PO), Take by mouth., Disp: , Rfl:    ferrous sulfate 325 (65 FE) MG tablet, Take 325 mg by mouth daily with breakfast., Disp: , Rfl:    levothyroxine (SYNTHROID, LEVOTHROID) 125 MCG tablet, Take 1 tablet (125 mcg total) by mouth daily with breakfast., Disp: 90 tablet, Rfl: 3   levothyroxine (SYNTHROID, LEVOTHROID) 125 MCG tablet, TAKE ONE TABLET BY MOUTH EVERY MORNING 30 MINUTES BEFORE BREAKFAST, Disp: 90 tablet, Rfl: 1   meclizine (ANTIVERT) 12.5 MG tablet, Take 12.5 mg by mouth 3 (three) times daily as needed for dizziness., Disp: , Rfl:    metoprolol succinate (TOPROL-XL) 25 MG 24 hr tablet, Take 25 mg daily by mouth., Disp: , Rfl: 11   montelukast (SINGULAIR) 10 MG tablet, Take 1 tablet (10 mg total) by mouth every morning., Disp: 90 tablet, Rfl: 1   nadolol (CORGARD) 40 MG tablet, Take 40 mg by mouth daily., Disp: , Rfl:    nitrofurantoin, macrocrystal-monohydrate, (MACROBID) 100 MG capsule, Take 1 capsule (100 mg total) by mouth 2 (two) times daily. Take with food., Disp: 10 capsule, Rfl: 0   pantoprazole (PROTONIX) 40 MG tablet, Take 1 tablet (40 mg total) by mouth daily., Disp: 90 tablet, Rfl: 3   potassium chloride (K-DUR) 10 MEQ tablet, Take 2 tablets (20 mEq total) by mouth daily. (Patient taking differently: Take 30 mEq daily by mouth. ), Disp: 180 tablet, Rfl: 3   rOPINIRole (REQUIP) 0.5 MG tablet, Take one tablet in the afternoon and two tablets at bedtime, Disp: 90 tablet, Rfl: 3   vitamin B-12 (CYANOCOBALAMIN) 1000 MCG tablet, Take 1,000 mcg by mouth daily., Disp: , Rfl:     fluticasone (FLONASE) 50 MCG/ACT nasal spray, Place 2 sprays into both nostrils daily. (Patient taking differently: Place 1 spray daily into both nostrils. ), Disp: 16 g, Rfl: 3  EXAM:  GENERAL: alert, oriented, appears well and in no acute distress  HEENT: atraumatic, conjunttiva clear, no obvious abnormalities on inspection of external nose and ears  NECK: normal movements of the head and neck  LUNGS: Speaking in full sentences, on inspection no signs of respiratory distress, breathing rate appears normal, no obvious gross SOB, gasping or wheezing  CV: no obvious cyanosis  MS: moves all visible extremities without noticeable abnormality  PSYCH/NEURO: pleasant and cooperative, no obvious depression or anxiety, speech and thought processing grossly intact  ASSESSMENT AND PLAN:  Discussed the following  assessment and plan:  1. BPPV (benign paroxysmal positional vertigo), bilateral  Patient has long history of vertigo, she will take 25 mg of meclizine to help combat symptoms.  Advised to continue to do exercises as recommended by ENT and give good fluid intake.  Advised if dizziness persists and worsens emergency I suspect she is just having breakthrough day of symptoms.  Increased meclizine should help to relieve this.  - meclizine (ANTIVERT) 25 MG tablet; Take 1 tablet (25 mg total) by mouth 3 (three) times daily as needed for dizziness.  Dispense: 30 tablet; Refill: 2    I discussed the assessment and treatment plan with the patient. The patient was provided an opportunity to ask questions and all were answered. The patient agreed with the plan and demonstrated an understanding of the instructions.   The patient was advised to call back or seek an in-person evaluation if the symptoms worsen or if the condition fails to improve as anticipated.   Jodelle Green, FNP

## 2019-01-23 NOTE — Telephone Encounter (Signed)
Patient was scheduled a virtual visit w/ Philis Nettle, NP this morning at 10:20 am.

## 2019-01-26 ENCOUNTER — Encounter (INDEPENDENT_AMBULATORY_CARE_PROVIDER_SITE_OTHER): Payer: Self-pay | Admitting: Vascular Surgery

## 2019-01-26 ENCOUNTER — Ambulatory Visit (INDEPENDENT_AMBULATORY_CARE_PROVIDER_SITE_OTHER): Payer: PPO | Admitting: Vascular Surgery

## 2019-01-26 ENCOUNTER — Other Ambulatory Visit: Payer: Self-pay

## 2019-01-26 VITALS — BP 144/80 | HR 76 | Resp 12 | Ht 64.0 in | Wt 170.0 lb

## 2019-01-26 DIAGNOSIS — I2510 Atherosclerotic heart disease of native coronary artery without angina pectoris: Secondary | ICD-10-CM

## 2019-01-26 DIAGNOSIS — I10 Essential (primary) hypertension: Secondary | ICD-10-CM | POA: Diagnosis not present

## 2019-01-26 DIAGNOSIS — I739 Peripheral vascular disease, unspecified: Secondary | ICD-10-CM

## 2019-01-26 DIAGNOSIS — I872 Venous insufficiency (chronic) (peripheral): Secondary | ICD-10-CM | POA: Diagnosis not present

## 2019-01-26 DIAGNOSIS — E782 Mixed hyperlipidemia: Secondary | ICD-10-CM

## 2019-01-26 DIAGNOSIS — I890 Lymphedema, not elsewhere classified: Secondary | ICD-10-CM

## 2019-01-30 ENCOUNTER — Encounter (INDEPENDENT_AMBULATORY_CARE_PROVIDER_SITE_OTHER): Payer: Self-pay | Admitting: Vascular Surgery

## 2019-01-30 DIAGNOSIS — I890 Lymphedema, not elsewhere classified: Secondary | ICD-10-CM | POA: Insufficient documentation

## 2019-01-30 DIAGNOSIS — I872 Venous insufficiency (chronic) (peripheral): Secondary | ICD-10-CM | POA: Insufficient documentation

## 2019-01-30 NOTE — Progress Notes (Signed)
MRN : [id number]  Deborah Deleon is a 84 y.o. (1935/04/24) female who presents with chief complaint of  Chief Complaint  Patient presents with  . New Patient (Initial Visit)  .  History of Present Illness:   Patient is seen for evaluation of leg pain and leg swelling. The patient first noticed the swelling remotely. The swelling is associated with pain and discoloration. The pain and swelling worsens with prolonged dependency and improves with elevation. The pain is unrelated to activity.  The patient notes that in the morning the legs are significantly improved but they steadily worsened throughout the course of the day. The patient also notes a steady worsening of the discoloration in the ankle and shin area.   The patient denies claudication symptoms.  The patient denies symptoms consistent with rest pain.  The patient denies and extensive history of DJD and LS spine disease.  The patient has no had any past angiography, interventions or vascular surgery.  Elevation makes the leg symptoms better, dependency makes them much worse. There is no history of ulcerations. The patient denies any recent changes in medications.  The patient has not been wearing graduated compression.  The patient denies a history of DVT or PE. There is no prior history of phlebitis. There is no history of primary lymphedema.  No history of malignancies. No history of trauma or groin or pelvic surgery. There is no history of radiation treatment to the groin or pelvis  The patient denies amaurosis fugax or recent TIA symptoms. There are no recent neurological changes noted. The patient denies recent episodes of angina or shortness of breath  Current Meds  Medication Sig  . acetaminophen (TYLENOL) 325 MG tablet Take 650 mg by mouth every 6 (six) hours as needed.  Marland Kitchen amLODipine (NORVASC) 5 MG tablet Take 5 mg by mouth daily.  Marland Kitchen aspirin EC 81 MG tablet Take 81 mg 2 (two) times daily by mouth.   .  cyclobenzaprine (FLEXERIL) 5 MG tablet Take 1 tablet (5 mg total) by mouth 3 (three) times daily as needed for muscle spasms.  . ferrous sulfate 325 (65 FE) MG tablet Take 325 mg by mouth daily with breakfast.  . fluticasone (FLONASE) 50 MCG/ACT nasal spray Place 2 sprays into both nostrils daily. (Patient taking differently: Place 1 spray daily into both nostrils. )  . furosemide (LASIX) 20 MG tablet Take 20 mg by mouth.  . levothyroxine (SYNTHROID, LEVOTHROID) 125 MCG tablet Take 1 tablet (125 mcg total) by mouth daily with breakfast.  . meclizine (ANTIVERT) 25 MG tablet Take 1 tablet (25 mg total) by mouth 3 (three) times daily as needed for dizziness.  . metoprolol succinate (TOPROL-XL) 25 MG 24 hr tablet Take 25 mg daily by mouth.  . montelukast (SINGULAIR) 10 MG tablet Take 1 tablet (10 mg total) by mouth every morning.  . pantoprazole (PROTONIX) 40 MG tablet Take 1 tablet (40 mg total) by mouth daily.  . potassium chloride (K-DUR) 10 MEQ tablet Take 2 tablets (20 mEq total) by mouth daily. (Patient taking differently: Take 30 mEq daily by mouth. )  . rOPINIRole (REQUIP) 0.5 MG tablet Take one tablet in the afternoon and two tablets at bedtime  . vitamin B-12 (CYANOCOBALAMIN) 1000 MCG tablet Take 1,000 mcg by mouth daily.    Past Medical History:  Diagnosis Date  . Allergy   . Cataract cortical, senile   . Celiac disease   . Chicken pox   . Collagenous colitis   .  Degenerative joint disease   . Diverticulosis   . Family history of adverse reaction to anesthesia    daughter climbs the walls with anesthesia  . GERD (gastroesophageal reflux disease)   . Hypertension   . Hypothyroidism   . Restless leg     Past Surgical History:  Procedure Laterality Date  . ABDOMINAL HYSTERECTOMY  1971  . APPENDECTOMY  1958  . CATARACT EXTRACTION W/ INTRAOCULAR LENS IMPLANT Bilateral 2010   left done April and right done in May  . CHOLECYSTECTOMY  1979  . COLONOSCOPY WITH PROPOFOL N/A  01/21/2016   Procedure: COLONOSCOPY WITH PROPOFOL;  Surgeon: Lollie Sails, MD;  Location: [facility] ENDOSCOPY;  Service: Endoscopy;  Laterality: N/A;  . KNEE ARTHROPLASTY Left 04/14/2017   Procedure: COMPUTER ASSISTED TOTAL KNEE ARTHROPLASTY;  Surgeon: Dereck Leep, MD;  Location: [facility] ORS;  Service: Orthopedics;  Laterality: Left;  . KNEE ARTHROSCOPY Left 02/17/2016   Procedure: ARTHROSCOPY KNEE Chondraplasty;  Surgeon: Dereck Leep, MD;  Location: [facility] ORS;  Service: Orthopedics;  Laterality: Left;  . ROTATOR CUFF REPAIR Left 2008  . TONSILLECTOMY      Social History Social History   Tobacco Use  . Smoking status: Never Smoker  . Smokeless tobacco: Never Used  Substance Use Topics  . Alcohol use: No  . Drug use: No    Family History Family History  Problem Relation Age of Onset  . Breast cancer Sister   . Heart disease Mother   . Diabetes Mother   . Lupus Mother   . Arthritis Father   . Heart disease Father   . Aneurysm Father   No family history of bleeding/clotting disorders, porphyria or autoimmune disease   Allergies  Allergen Reactions  . Penicillins Swelling    Has patient had a PCN reaction causing immediate rash, facial/tongue/throat swelling, SOB or lightheadedness with hypotension: Yes Has patient had a PCN reaction causing severe rash involving mucus membranes or skin necrosis: No Has patient had a PCN reaction that required hospitalization: No Has patient had a PCN reaction occurring within the last 10 years: No If all of the above answers are "NO", then may proceed with Cephalosporin use.   . Sulfa Antibiotics Rash     REVIEW OF SYSTEMS (Negative unless checked)  Constitutional: [] Weight loss  [] Fever  [] Chills Cardiac: [] Chest pain   [] Chest pressure   [] Palpitations   [] Shortness of breath when laying flat   [] Shortness of breath with exertion. Vascular:  [x] Pain in legs with walking   [x] Pain in legs at rest  [] History of DVT   [] Phlebitis    [x] Swelling in legs   [] Varicose veins   [] Non-healing ulcers Pulmonary:   [] Uses home oxygen   [] Productive cough   [] Hemoptysis   [] Wheeze  [] COPD   [] Asthma Neurologic:  [] Dizziness   [] Seizures   [] History of stroke   [] History of TIA  [] Aphasia   [] Vissual changes   [] Weakness or numbness in arm   [] Weakness or numbness in leg Musculoskeletal:   [] Joint swelling   [] Joint pain   [] Low back pain Hematologic:  [] Easy bruising  [] Easy bleeding   [] Hypercoagulable state   [] Anemic Gastrointestinal:  [] Diarrhea   [] Vomiting  [] Gastroesophageal reflux/heartburn   [] Difficulty swallowing. Genitourinary:  [] Chronic kidney disease   [] Difficult urination  [] Frequent urination   [] Blood in urine Skin:  [] Rashes   [] Ulcers  Psychological:  [] History of anxiety   []  History of major depression.  Physical Examination  Vitals:   01/26/19  1332  BP: (!) 144/80  Pulse: 76  Resp: 12  Weight: 170 lb (77.1 kg)  Height: 5\' 4"  (1.626 m)   Body mass index is 29.18 kg/m. Gen: WD/WN, NAD Head: /AT, No temporalis wasting.  Ear/Nose/Throat: Hearing grossly intact, nares w/o erythema or drainage, poor dentition Eyes: PER, EOMI, sclera nonicteric.  Neck: Supple, no masses.  No bruit or JVD.  Pulmonary:  Good air movement, clear to auscultation bilaterally, no use of accessory muscles.  Cardiac: RRR, normal S1, S2, no Murmurs. Vascular: scattered varicosities present bilaterally.  Mild venous stasis changes to the legs bilaterally.  3-4+ soft pitting edema Vessel Right Left  Radial Palpable Palpable  PT Not Palpable Not Palpable  DP Not Palpable Not Palpable  Gastrointestinal: soft, non-distended. No guarding/no peritoneal signs.  Musculoskeletal: M/S 5/5 throughout.  No deformity or atrophy.  Neurologic: CN 2-12 intact. Pain and light touch intact in extremities.  Symmetrical.  Speech is fluent. Motor exam as listed above. Psychiatric: Judgment intact, Mood & affect appropriate for pt's clinical  situation. Dermatologic: No rashes or ulcers noted.  No changes consistent with cellulitis. Lymph : No Cervical lymphadenopathy, no lichenification or skin changes of chronic lymphedema.  CBC Lab Results  Component Value Date   WBC 4.1 09/13/2018   HGB 12.5 09/13/2018   HCT 35.9 (L) 09/13/2018   MCV 101.4 (H) 09/13/2018   PLT 220.0 09/13/2018    BMET    Component Value Date/Time   NA 134 (L) 09/13/2018 1403   NA 136 04/24/2016 0817   K 4.4 09/13/2018 1403   CL 98 09/13/2018 1403   CO2 30 09/13/2018 1403   GLUCOSE 99 09/13/2018 1403   BUN 18 09/13/2018 1403   BUN 11 04/24/2016 0817   CREATININE 0.54 09/13/2018 1403   CREATININE 0.58 (L) 12/20/2015 1612   CALCIUM 9.6 09/13/2018 1403   GFRNONAA >60 04/18/2017 0336   GFRAA >60 04/18/2017 0336   CrCl cannot be calculated (Patient's most recent lab result is older than the maximum 21 days allowed.).  COAG Lab Results  Component Value Date   INR 0.94 04/07/2017    Radiology No results found.  Assessment/Plan 1. Lymphedema I have had a long discussion with the patient regarding swelling and why it  causes symptoms.  Patient will begin wearing graduated compression stockings 15-20 mmHg on a daily basis a prescription was given. The patient will  beginning wearing the stockings first thing in the morning and removing them in the evening. The patient is instructed specifically not to sleep in the stockings.   In addition, behavioral modification will be initiated.  This will include frequent elevation, use of over the counter pain medications and exercise such as walking.  I have reviewed systemic causes for chronic edema such as liver, kidney and cardiac etiologies.  The patient denies problems with these organ systems.    Consideration for a lymph pump will also be made based upon the effectiveness of conservative therapy.  This would help to improve the edema control and prevent sequela such as ulcers and infections    Patient should undergo duplex ultrasound of the venous system to ensure that DVT or reflux is not present.  The patient will follow-up with me after the ultrasound.   - VAS [place] LOWER EXTREMITY VENOUS REFLUX; Future  2. Chronic venous insufficiency No surgery or intervention at this point in time.    I have had a long discussion with the patient regarding venous insufficiency and why it  causes symptoms. I have discussed with the patient the chronic skin changes that accompany venous insufficiency and the long term sequela such as infection and ulceration.  Patient will begin wearing graduated compression stockings 15-20 mmHg or compression wraps on a daily basis a prescription was given. The patient will put the stockings on first thing in the morning and removing them in the evening. The patient is instructed specifically not to sleep in the stockings.    In addition, behavioral modification including several periods of elevation of the lower extremities during the day will be continued. I have demonstrated that proper elevation is a position with the ankles at heart level.  The patient is instructed to begin routine exercise, especially walking on a daily basis  Patient should undergo duplex ultrasound of the venous system to ensure that DVT or reflux is not present.  Following the review of the ultrasound the patient will follow up in 2-3 months to reassess the degree of swelling and the control that graduated compression stockings or compression wraps  is offering.   The patient can be assessed for a Lymph Pump at that time - VAS [place] LOWER EXTREMITY VENOUS REFLUX; Future  3. PAD (peripheral artery disease) (HCC)  Recommend:  The patient has atypical pain symptoms for pure atherosclerotic disease. However, on physical exam there is evidence of mixed venous and arterial disease, given the diminished pulses and the edema associated with venous changes of the legs.  Noninvasive studies  including ABI's and venous ultrasound of the legs will be obtained and the patient will follow up with me to review these studies.  The patient should continue walking and begin a more formal exercise program. The patient should continue his antiplatelet therapy and aggressive treatment of the lipid abnormalities.  The patient should begin wearing graduated compression socks 15-20 mmHg strength to control edema.   - VAS [place] ABI WITH/WO TBI; Future  4. Coronary artery disease involving native heart, angina presence unspecified, unspecified vessel or lesion type Continue cardiac and antihypertensive medications as already ordered and reviewed, no changes at this time.  Continue statin as ordered and reviewed, no changes at this time  Nitrates PRN for chest pain   5. Essential hypertension Continue antihypertensive medications as already ordered, these medications have been reviewed and there are no changes at this time.   6. Mixed hyperlipidemia Continue statin as ordered and reviewed, no changes at this time     Hortencia Pilar, MD  01/30/2019 10:41 AM

## 2019-02-07 DIAGNOSIS — I340 Nonrheumatic mitral (valve) insufficiency: Secondary | ICD-10-CM | POA: Diagnosis not present

## 2019-02-07 DIAGNOSIS — R0602 Shortness of breath: Secondary | ICD-10-CM | POA: Diagnosis not present

## 2019-02-07 DIAGNOSIS — E785 Hyperlipidemia, unspecified: Secondary | ICD-10-CM | POA: Diagnosis not present

## 2019-02-07 DIAGNOSIS — I10 Essential (primary) hypertension: Secondary | ICD-10-CM | POA: Diagnosis not present

## 2019-02-07 DIAGNOSIS — I351 Nonrheumatic aortic (valve) insufficiency: Secondary | ICD-10-CM | POA: Diagnosis not present

## 2019-02-07 DIAGNOSIS — I2510 Atherosclerotic heart disease of native coronary artery without angina pectoris: Secondary | ICD-10-CM | POA: Diagnosis not present

## 2019-02-13 DIAGNOSIS — R0602 Shortness of breath: Secondary | ICD-10-CM | POA: Diagnosis not present

## 2019-02-13 DIAGNOSIS — I341 Nonrheumatic mitral (valve) prolapse: Secondary | ICD-10-CM | POA: Diagnosis not present

## 2019-02-13 DIAGNOSIS — I340 Nonrheumatic mitral (valve) insufficiency: Secondary | ICD-10-CM | POA: Diagnosis not present

## 2019-02-13 DIAGNOSIS — I2510 Atherosclerotic heart disease of native coronary artery without angina pectoris: Secondary | ICD-10-CM | POA: Diagnosis not present

## 2019-02-13 DIAGNOSIS — I10 Essential (primary) hypertension: Secondary | ICD-10-CM | POA: Diagnosis not present

## 2019-02-14 DIAGNOSIS — K900 Celiac disease: Secondary | ICD-10-CM | POA: Diagnosis not present

## 2019-02-14 DIAGNOSIS — K228 Other specified diseases of esophagus: Secondary | ICD-10-CM | POA: Diagnosis not present

## 2019-02-14 DIAGNOSIS — K52831 Collagenous colitis: Secondary | ICD-10-CM | POA: Diagnosis not present

## 2019-02-14 DIAGNOSIS — K449 Diaphragmatic hernia without obstruction or gangrene: Secondary | ICD-10-CM | POA: Diagnosis not present

## 2019-02-20 ENCOUNTER — Other Ambulatory Visit: Payer: Self-pay | Admitting: Family Medicine

## 2019-02-20 DIAGNOSIS — G4733 Obstructive sleep apnea (adult) (pediatric): Secondary | ICD-10-CM | POA: Diagnosis not present

## 2019-02-20 DIAGNOSIS — R0602 Shortness of breath: Secondary | ICD-10-CM | POA: Diagnosis not present

## 2019-02-20 DIAGNOSIS — I340 Nonrheumatic mitral (valve) insufficiency: Secondary | ICD-10-CM | POA: Diagnosis not present

## 2019-02-20 DIAGNOSIS — I10 Essential (primary) hypertension: Secondary | ICD-10-CM | POA: Diagnosis not present

## 2019-02-20 DIAGNOSIS — E785 Hyperlipidemia, unspecified: Secondary | ICD-10-CM | POA: Diagnosis not present

## 2019-02-20 NOTE — Telephone Encounter (Signed)
Copied from [place] [id number]. Topic: Quick Communication - Rx Refill/Question >> Feb 20, 2019  4:19 PM Leward Quan A wrote: Medication: fluticasone (FLONASE) 50 MCG/ACT nasal spray  Has the patient contacted their pharmacy? yes  (Agent: If no, request that the patient contact the pharmacy for the refill.) (Agent: If yes, when and what did the pharmacy advise?)  Preferred Pharmacy (with phone number or street name): [place], [place] - [place] [phone] (Phone) [phone] (Fax)    Agent: Please be advised that RX refills may take up to 3 business days. We ask that you follow-up with your pharmacy.

## 2019-02-20 NOTE — Telephone Encounter (Signed)
Requested medication (s) are due for refill today: yes  Requested medication (s) are on the active medication list: yes  Last refill:  07/10/2016  Future visit scheduled: yes  Notes to clinic: expired RX historic provider    Requested Prescriptions  Pending Prescriptions Disp Refills   fluticasone (FLONASE) 50 MCG/ACT nasal spray 16 g 3    Sig: Place 2 sprays into both nostrils daily.     Ear, Nose, and Throat: Nasal Preparations - Corticosteroids Passed - 02/20/2019  4:25 PM      Passed - Valid encounter within last 12 months    Recent Outpatient Visits          4 weeks ago BPPV (benign paroxysmal positional vertigo), bilateral   [place], FNP   1 month ago Peripheral artery disease [facility])   [place] Guse, Jacquelynn Cree, FNP   4 months ago Damon, Yvetta Coder, FNP   5 months ago Essential hypertension   [place] Guse, Jacquelynn Cree, FNP   8 months ago Dizziness   [place] Guse, Jacquelynn Cree, FNP      Future Appointments            In 1 month Guse, Jacquelynn Cree, [place], [place]   In 11 months O'Brien-Blaney, Bryson Corona, LPN [place], [place]

## 2019-02-23 ENCOUNTER — Other Ambulatory Visit: Payer: Self-pay | Admitting: Lab

## 2019-02-23 ENCOUNTER — Telehealth: Payer: Self-pay

## 2019-02-23 DIAGNOSIS — R05 Cough: Secondary | ICD-10-CM | POA: Diagnosis not present

## 2019-02-23 MED ORDER — FLUTICASONE PROPIONATE 50 MCG/ACT NA SUSP: 2.0000 | Freq: Every day | NASAL | 3 refills | Status: DC

## 2019-02-23 NOTE — Telephone Encounter (Signed)
Can this be a Rx request?

## 2019-02-23 NOTE — Telephone Encounter (Signed)
Thank you :)

## 2019-02-23 NOTE — Telephone Encounter (Signed)
Received a call from [facility] requesting a refill for Flonase.  Stated patient is going out of town today and needs refill.

## 2019-02-23 NOTE — Telephone Encounter (Signed)
I Sent in Refill for Flonase to pharmacy

## 2019-02-23 NOTE — Telephone Encounter (Signed)
Received a call from [facility] requesting a refill for Flonase.  Stated patient is going out of town today and needs refill.

## 2019-03-21 DIAGNOSIS — Z1159 Encounter for screening for other viral diseases: Secondary | ICD-10-CM | POA: Diagnosis not present

## 2019-03-23 DIAGNOSIS — R0602 Shortness of breath: Secondary | ICD-10-CM | POA: Diagnosis not present

## 2019-03-30 ENCOUNTER — Encounter (INDEPENDENT_AMBULATORY_CARE_PROVIDER_SITE_OTHER): Payer: PPO

## 2019-03-30 ENCOUNTER — Ambulatory Visit (INDEPENDENT_AMBULATORY_CARE_PROVIDER_SITE_OTHER): Payer: PPO | Admitting: Vascular Surgery

## 2019-04-04 ENCOUNTER — Other Ambulatory Visit: Payer: Self-pay

## 2019-04-04 DIAGNOSIS — H8113 Benign paroxysmal vertigo, bilateral: Secondary | ICD-10-CM

## 2019-04-04 MED ORDER — MECLIZINE HCL 25 MG PO TABS: 25.0000 mg | ORAL_TABLET | Freq: Three times a day (TID) | ORAL | 2 refills | Status: DC | PRN

## 2019-04-05 ENCOUNTER — Ambulatory Visit (INDEPENDENT_AMBULATORY_CARE_PROVIDER_SITE_OTHER): Payer: PPO | Admitting: Family Medicine

## 2019-04-05 ENCOUNTER — Other Ambulatory Visit: Payer: Self-pay

## 2019-04-05 ENCOUNTER — Encounter: Payer: Self-pay | Admitting: Family Medicine

## 2019-04-05 VITALS — BP 122/62 | HR 61 | Temp 97.7°F | Wt 168.0 lb

## 2019-04-05 DIAGNOSIS — H8113 Benign paroxysmal vertigo, bilateral: Secondary | ICD-10-CM

## 2019-04-05 DIAGNOSIS — G2581 Restless legs syndrome: Secondary | ICD-10-CM | POA: Diagnosis not present

## 2019-04-05 DIAGNOSIS — I739 Peripheral vascular disease, unspecified: Secondary | ICD-10-CM | POA: Diagnosis not present

## 2019-04-05 DIAGNOSIS — E039 Hypothyroidism, unspecified: Secondary | ICD-10-CM

## 2019-04-05 DIAGNOSIS — I2510 Atherosclerotic heart disease of native coronary artery without angina pectoris: Secondary | ICD-10-CM

## 2019-04-05 DIAGNOSIS — E611 Iron deficiency: Secondary | ICD-10-CM | POA: Diagnosis not present

## 2019-04-05 DIAGNOSIS — E559 Vitamin D deficiency, unspecified: Secondary | ICD-10-CM | POA: Diagnosis not present

## 2019-04-05 DIAGNOSIS — I10 Essential (primary) hypertension: Secondary | ICD-10-CM

## 2019-04-05 LAB — CBC WITH DIFFERENTIAL/PLATELET
Basophils Absolute: 0 10*3/uL (ref 0.0–0.1)
Basophils Relative: 1.1 % (ref 0.0–3.0)
Eosinophils Absolute: 0.1 10*3/uL (ref 0.0–0.7)
Eosinophils Relative: 1.8 % (ref 0.0–5.0)
HCT: 35.5 % — ABNORMAL LOW (ref 36.0–46.0)
Hemoglobin: 12.1 g/dL (ref 12.0–15.0)
Lymphocytes Relative: 43.6 % (ref 12.0–46.0)
Lymphs Abs: 1.7 10*3/uL (ref 0.7–4.0)
MCHC: 34 g/dL (ref 30.0–36.0)
MCV: 98.9 fl (ref 78.0–100.0)
Monocytes Absolute: 0.5 10*3/uL (ref 0.1–1.0)
Monocytes Relative: 12.5 % — ABNORMAL HIGH (ref 3.0–12.0)
Neutro Abs: 1.6 10*3/uL (ref 1.4–7.7)
Neutrophils Relative %: 41 % — ABNORMAL LOW (ref 43.0–77.0)
Platelets: 199 10*3/uL (ref 150.0–400.0)
RBC: 3.59 Mil/uL — ABNORMAL LOW (ref 3.87–5.11)
RDW: 13.4 % (ref 11.5–15.5)
WBC: 3.8 10*3/uL — ABNORMAL LOW (ref 4.0–10.5)

## 2019-04-05 LAB — COMPREHENSIVE METABOLIC PANEL
ALT: 12 U/L (ref 0–35)
AST: 14 U/L (ref 0–37)
Albumin: 4 g/dL (ref 3.5–5.2)
Alkaline Phosphatase: 89 U/L (ref 39–117)
BUN: 22 mg/dL (ref 6–23)
CO2: 31 mEq/L (ref 19–32)
Calcium: 9.6 mg/dL (ref 8.4–10.5)
Chloride: 100 mEq/L (ref 96–112)
Creatinine, Ser: 0.67 mg/dL (ref 0.40–1.20)
GFR: 84.19 mL/min (ref >60.00)
Glucose, Bld: 86 mg/dL (ref 70–99)
Potassium: 4.6 mEq/L (ref 3.5–5.1)
Sodium: 136 mEq/L (ref 135–145)
Total Bilirubin: 0.5 mg/dL (ref 0.2–1.2)
Total Protein: 5.9 g/dL — ABNORMAL LOW (ref 6.0–8.3)

## 2019-04-05 LAB — LIPID PANEL
Cholesterol: 173 mg/dL (ref 0–200)
HDL: 29.4 mg/dL — ABNORMAL LOW (ref >39.00)
LDL Cholesterol: 109 mg/dL — ABNORMAL HIGH (ref 0–99)
NonHDL: 143.39
Total CHOL/HDL Ratio: 6
Triglycerides: 171 mg/dL — ABNORMAL HIGH (ref 0.0–149.0)
VLDL: 34.2 mg/dL (ref 0.0–40.0)

## 2019-04-05 LAB — B12 AND FOLATE PANEL
Folate: 12.4 ng/mL (ref >5.9)
Vitamin B-12: 249 pg/mL (ref 211–911)

## 2019-04-05 LAB — IBC + FERRITIN
Ferritin: 145 ng/mL (ref 10.0–291.0)
Iron: 137 ug/dL (ref 42–145)
Saturation Ratios: 48.2 % (ref 20.0–50.0)
Transferrin: 203 mg/dL — ABNORMAL LOW (ref 212.0–360.0)

## 2019-04-05 LAB — VITAMIN D 25 HYDROXY (VIT D DEFICIENCY, FRACTURES): VITD: 27.95 ng/mL — ABNORMAL LOW (ref 30.00–100.00)

## 2019-04-05 LAB — TSH: TSH: 1.16 u[IU]/mL (ref 0.35–4.50)

## 2019-04-05 MED ORDER — ROPINIROLE HCL 0.5 MG PO TABS: ORAL_TABLET | ORAL | 3 refills | Status: DC

## 2019-04-05 MED ORDER — MECLIZINE HCL 25 MG PO TABS: 25.0000 mg | ORAL_TABLET | Freq: Three times a day (TID) | ORAL | 2 refills | Status: AC | PRN

## 2019-04-05 MED ORDER — LEVOTHYROXINE SODIUM 125 MCG PO TABS: 125.0000 ug | ORAL_TABLET | Freq: Every day | ORAL | 3 refills | Status: DC

## 2019-04-05 NOTE — Progress Notes (Signed)
Subjective:    Patient ID: Deborah Deleon, female    DOB: 15-Feb-1935, 84 y.o.   MRN: [id number]  HPI   Patient presents to clinic for follow-up on thyroid, vitamin D and iron deficiency, blood pressure./CAD, dizziness.  Overall reports she is feeling quite well.    Blood pressures been stable.  Energy level has been normal.  Uses meclizine as needed for dizziness.  Does need refills Requip, meclizine and thyroid medicine.  We will get new labs today for regular follow-up  Patient Active Problem List   Diagnosis Date Noted  . Lymphedema 01/30/2019  . Chronic venous insufficiency 01/30/2019  . PAD (peripheral artery disease) ([place]) 01/09/2019  . Dysuria 10/10/2018  . Vertigo 11/09/2017  . Decreased white blood cell count 08/23/2017  . Iron deficiency anemia 08/23/2017  . S/P TKR (total knee replacement) using cement, left 04/14/2017  . Primary osteoarthritis of left knee 04/04/2017  . OSA (obstructive sleep apnea) 03/22/2017  . HLD (hyperlipidemia) 02/18/2017  . Venous stasis 07/10/2016  . CAD (coronary artery disease) 06/18/2016  . Aortic insufficiency 06/18/2016  . Mitral regurgitation 06/18/2016  . Essential hypertension 12/22/2015  . Collagenous colitis 12/22/2015  . Celiac disease 12/22/2015  . Hypothyroidism 01/09/2015  . Restless legs syndrome 01/09/2015  . Pulmonary nodule 11/27/2013  . GERD (gastroesophageal reflux disease) 11/13/2013   Social History   Tobacco Use  . Smoking status: Never Smoker  . Smokeless tobacco: Never Used  Substance Use Topics  . Alcohol use: No    Review of Systems  Constitutional: Negative for chills, fatigue and fever.  HENT: Negative for congestion, ear pain, sinus pain and sore throat.   Eyes: Negative.   Respiratory: Negative for cough, shortness of breath and wheezing.   Cardiovascular: Negative for chest pain, palpitations and leg swelling.  Gastrointestinal: Negative for abdominal pain, diarrhea, nausea and vomiting.   Genitourinary: Negative for dysuria, frequency and urgency.  Musculoskeletal: Negative for arthralgias and myalgias.  Skin: Negative for color change, pallor and rash.  Neurological: Negative for syncope, light-headedness and headaches.  Psychiatric/Behavioral: The patient is not nervous/anxious.       Objective:   Physical Exam Vitals signs and nursing note reviewed.  Constitutional:      General: She is not in acute distress.    Appearance: She is not ill-appearing or toxic-appearing.  HENT:     Head: Normocephalic and atraumatic.     Right Ear: Tympanic membrane, ear canal and external ear normal.     Left Ear: Tympanic membrane, ear canal and external ear normal.  Eyes:     General: No scleral icterus.    Extraocular Movements: Extraocular movements intact.     Conjunctiva/sclera: Conjunctivae normal.     Pupils: Pupils are equal, round, and reactive to light.  Neck:     Musculoskeletal: Normal range of motion and neck supple. No neck rigidity.  Cardiovascular:     Rate and Rhythm: Normal rate and regular rhythm.     Heart sounds: Normal heart sounds.  Pulmonary:     Effort: Pulmonary effort is normal. No respiratory distress.     Breath sounds: Normal breath sounds.  Musculoskeletal:     Right lower leg: No edema.     Left lower leg: No edema.  Skin:    General: Skin is warm and dry.     Coloration: Skin is not jaundiced or pale.  Neurological:     Mental Status: She is alert and oriented to person, place, and time.  Gait: Gait normal.  Psychiatric:        Mood and Affect: Mood normal.        Behavior: Behavior normal.     Today's Vitals   04/05/19 0809  BP: 122/62  Pulse: 61  Temp: 97.7 F (36.5 C)  SpO2: 97%  Weight: 168 lb (76.2 kg)   Body mass index is 28.84 kg/m.     Assessment & Plan:    Essential hypertension - Plan: Comp Met (CMET), TSH, Lipid panel  Peripheral artery disease (HCC) - Plan: Lipid panel  BPPV (benign paroxysmal  positional vertigo), bilateral - Plan: meclizine (ANTIVERT) 25 MG tablet  Hypothyroidism, unspecified type - Plan: TSH, Lipid panel, levothyroxine (SYNTHROID) 125 MCG tablet  Iron deficiency - Plan: CBC w/Diff, IBC + Ferritin  Vitamin D deficiency - Plan: VITAMIN D 25 Hydroxy (Vit-D Deficiency, Fractures), B12 and Folate Panel  Coronary artery disease involving native heart  Restless leg syndrome - Plan: rOPINIRole (REQUIP) 0.5 MG tablet   Patient's chronic medical issues are stable.  We will get follow-up labs today.  Medication refills needed sent into pharmacy.  She will follow-up with cardiology/vascular as she regularly does.  Flu vaccine up-to-date for this season.  She will follow-up approximately every 3 or 4 months here in clinic for regular recheck/management of chronic conditions.

## 2019-04-11 ENCOUNTER — Ambulatory Visit: Payer: PPO | Admitting: Family Medicine

## 2019-04-13 ENCOUNTER — Ambulatory Visit: Payer: PPO | Admitting: Family Medicine

## 2019-05-24 ENCOUNTER — Other Ambulatory Visit: Payer: Self-pay | Admitting: Internal Medicine

## 2019-05-24 DIAGNOSIS — J309 Allergic rhinitis, unspecified: Secondary | ICD-10-CM

## 2019-05-24 DIAGNOSIS — Z20828 Contact with and (suspected) exposure to other viral communicable diseases: Secondary | ICD-10-CM | POA: Diagnosis not present

## 2019-05-24 MED ORDER — MONTELUKAST SODIUM 10 MG PO TABS: 10.0000 mg | ORAL_TABLET | ORAL | 1 refills | Status: DC

## 2019-06-22 DIAGNOSIS — G4733 Obstructive sleep apnea (adult) (pediatric): Secondary | ICD-10-CM | POA: Diagnosis not present

## 2019-06-22 DIAGNOSIS — J479 Bronchiectasis, uncomplicated: Secondary | ICD-10-CM | POA: Diagnosis not present

## 2019-06-22 DIAGNOSIS — I10 Essential (primary) hypertension: Secondary | ICD-10-CM | POA: Diagnosis not present

## 2019-06-22 DIAGNOSIS — I340 Nonrheumatic mitral (valve) insufficiency: Secondary | ICD-10-CM | POA: Diagnosis not present

## 2019-06-22 DIAGNOSIS — I2510 Atherosclerotic heart disease of native coronary artery without angina pectoris: Secondary | ICD-10-CM | POA: Diagnosis not present

## 2019-06-22 DIAGNOSIS — I351 Nonrheumatic aortic (valve) insufficiency: Secondary | ICD-10-CM | POA: Diagnosis not present

## 2019-06-22 DIAGNOSIS — E785 Hyperlipidemia, unspecified: Secondary | ICD-10-CM | POA: Diagnosis not present

## 2019-06-28 ENCOUNTER — Encounter: Payer: Self-pay | Admitting: Internal Medicine

## 2019-06-28 ENCOUNTER — Ambulatory Visit (INDEPENDENT_AMBULATORY_CARE_PROVIDER_SITE_OTHER): Payer: PPO | Admitting: Internal Medicine

## 2019-06-28 ENCOUNTER — Other Ambulatory Visit: Payer: Self-pay

## 2019-06-28 VITALS — BP 138/72 | Ht 65.0 in | Wt 171.0 lb

## 2019-06-28 DIAGNOSIS — R911 Solitary pulmonary nodule: Secondary | ICD-10-CM

## 2019-06-28 DIAGNOSIS — G2581 Restless legs syndrome: Secondary | ICD-10-CM

## 2019-06-28 DIAGNOSIS — Z8719 Personal history of other diseases of the digestive system: Secondary | ICD-10-CM | POA: Diagnosis not present

## 2019-06-28 DIAGNOSIS — I2510 Atherosclerotic heart disease of native coronary artery without angina pectoris: Secondary | ICD-10-CM

## 2019-06-28 DIAGNOSIS — K219 Gastro-esophageal reflux disease without esophagitis: Secondary | ICD-10-CM | POA: Diagnosis not present

## 2019-06-28 DIAGNOSIS — K52831 Collagenous colitis: Secondary | ICD-10-CM | POA: Diagnosis not present

## 2019-06-28 DIAGNOSIS — J4520 Mild intermittent asthma, uncomplicated: Secondary | ICD-10-CM | POA: Insufficient documentation

## 2019-06-28 DIAGNOSIS — K317 Polyp of stomach and duodenum: Secondary | ICD-10-CM | POA: Diagnosis not present

## 2019-06-28 DIAGNOSIS — M8580 Other specified disorders of bone density and structure, unspecified site: Secondary | ICD-10-CM

## 2019-06-28 DIAGNOSIS — R7303 Prediabetes: Secondary | ICD-10-CM

## 2019-06-28 DIAGNOSIS — I10 Essential (primary) hypertension: Secondary | ICD-10-CM

## 2019-06-28 DIAGNOSIS — K760 Fatty (change of) liver, not elsewhere classified: Secondary | ICD-10-CM | POA: Diagnosis not present

## 2019-06-28 DIAGNOSIS — D649 Anemia, unspecified: Secondary | ICD-10-CM

## 2019-06-28 DIAGNOSIS — M1990 Unspecified osteoarthritis, unspecified site: Secondary | ICD-10-CM | POA: Diagnosis not present

## 2019-06-28 DIAGNOSIS — E039 Hypothyroidism, unspecified: Secondary | ICD-10-CM

## 2019-06-28 DIAGNOSIS — M62830 Muscle spasm of back: Secondary | ICD-10-CM

## 2019-06-28 DIAGNOSIS — R42 Dizziness and giddiness: Secondary | ICD-10-CM

## 2019-06-28 DIAGNOSIS — K449 Diaphragmatic hernia without obstruction or gangrene: Secondary | ICD-10-CM | POA: Diagnosis not present

## 2019-06-28 DIAGNOSIS — I340 Nonrheumatic mitral (valve) insufficiency: Secondary | ICD-10-CM

## 2019-06-28 DIAGNOSIS — E538 Deficiency of other specified B group vitamins: Secondary | ICD-10-CM

## 2019-06-28 DIAGNOSIS — J479 Bronchiectasis, uncomplicated: Secondary | ICD-10-CM

## 2019-06-28 DIAGNOSIS — E559 Vitamin D deficiency, unspecified: Secondary | ICD-10-CM

## 2019-06-28 DIAGNOSIS — I351 Nonrheumatic aortic (valve) insufficiency: Secondary | ICD-10-CM

## 2019-06-28 DIAGNOSIS — J4540 Moderate persistent asthma, uncomplicated: Secondary | ICD-10-CM

## 2019-06-28 MED ORDER — CYCLOBENZAPRINE HCL 5 MG PO TABS: 5.0000 mg | ORAL_TABLET | Freq: Every day | ORAL | 1 refills | Status: AC | PRN

## 2019-06-28 NOTE — Progress Notes (Signed)
Telephone Note  I connected with Deborah Deleon  on 06/28/19 at  3:20 PM EST by telephone and verified that I am speaking with the correct person using two identifiers.  Location patient: home Location provider:work or home office Persons participating in the virtual visit: patient, provider  I discussed the limitations of evaluation and management by telemedicine and the availability of in person appointments. The patient expressed understanding and agreed to proceed.   HPI: TOC sister is [place] Sheer also a pt no complaints tody but chronic medical issues and extensive chart review with labs, imaging  1. HTN BP 138/72 today  2. Hypothyroidism on levo 125 mg qd due for fasting labs  3. Bronchiectasis/mod persistent asthma f/u [facility] just got neb pulmicort and duoneb and stopped trelegy. Also on Bret[place] was supposed to be  4. H/o vertigo no sx's currently she takes meclizine prn  5. Of note had fall 8-9 months ago and hurt her low back and takes prn Flexeril   ROS: See pertinent positives and negatives per HPI. General: down 10-12 lbs   Past Medical History:  Diagnosis Date  . Allergy   . Cataract cortical, senile   . Celiac disease   . Chicken pox   . Collagenous colitis   . Degenerative joint disease   . Diverticulosis   . Family history of adverse reaction to anesthesia    daughter climbs the walls with anesthesia  . GERD (gastroesophageal reflux disease)   . Hypertension   . Hypothyroidism   . Restless leg     Past Surgical History:  Procedure Laterality Date  . ABDOMINAL HYSTERECTOMY  1971  . APPENDECTOMY  1958  . CATARACT EXTRACTION W/ INTRAOCULAR LENS IMPLANT Bilateral 2010   left done April and right done in May  . CHOLECYSTECTOMY  1979  . COLONOSCOPY WITH PROPOFOL N/A 01/21/2016   Procedure: COLONOSCOPY WITH PROPOFOL;  Surgeon: Lollie Sails, MD;  Location: [facility] ENDOSCOPY;  Service: Endoscopy;  Laterality: N/A;  . KNEE ARTHROPLASTY Left 04/14/2017   Procedure: COMPUTER ASSISTED TOTAL KNEE ARTHROPLASTY;  Surgeon: Dereck Leep, MD;  Location: [facility] ORS;  Service: Orthopedics;  Laterality: Left;  . KNEE ARTHROSCOPY Left 02/17/2016   Procedure: ARTHROSCOPY KNEE Chondraplasty;  Surgeon: Dereck Leep, MD;  Location: [facility] ORS;  Service: Orthopedics;  Laterality: Left;  . ROTATOR CUFF REPAIR Left 2008  . TONSILLECTOMY      Family History  Problem Relation Age of Onset  . Breast cancer Sister   . Heart disease Mother   . Diabetes Mother   . Lupus Mother   . Arthritis Father   . Heart disease Father   . Aneurysm Father   . Heart disease Daughter        heart surgery    SOCIAL HX:  Lives at [facility] retirement home division of [place] great relative as of 06/2019 who is 2.5 months old on tues/thurs 5 kids living, 3 kids deceased  Studied in the past fitting prosthesis  Enjoys church Widowed   Current Outpatient Medications:  .  acetaminophen (TYLENOL) 325 MG tablet, Take 650 mg by mouth every 6 (six) hours as needed., Disp: , Rfl:  .  aspirin EC 81 MG tablet, Take 81 mg 2 (two) times daily by mouth. , Disp: , Rfl:  .  budesonide (PULMICORT) 0.25 MG/2ML nebulizer solution, Take 0.25 mg by nebulization daily. , Disp: , Rfl:  .  cyclobenzaprine (FLEXERIL) 5 MG tablet, Take 1 tablet (5 mg  total) by mouth daily as needed for muscle spasms. RLS, Disp: 30 tablet, Rfl: 1 .  ferrous sulfate 325 (65 FE) MG tablet, Take 325 mg by mouth daily with breakfast., Disp: , Rfl:  .  fluticasone (FLONASE) 50 MCG/ACT nasal spray, Place 2 sprays into both nostrils daily., Disp: 16 g, Rfl: 3 .  furosemide (LASIX) 20 MG tablet, Take 20 mg by mouth., Disp: , Rfl:  .  ipratropium-albuterol (DUONEB) 0.5-2.5 (3) MG/3ML SOLN, Take 3 mLs by nebulization every 4 (four) hours as needed. , Disp: , Rfl:  .  levothyroxine (SYNTHROID) 125 MCG tablet, Take 1 tablet (125 mcg total) by mouth daily with breakfast., Disp: 90 tablet, Rfl: 3 .  meclizine  (ANTIVERT) 25 MG tablet, Take 1 tablet (25 mg total) by mouth 3 (three) times daily as needed for dizziness., Disp: 30 tablet, Rfl: 2 .  metoprolol succinate (TOPROL-XL) 25 MG 24 hr tablet, Take 25 mg daily by mouth., Disp: , Rfl: 11 .  montelukast (SINGULAIR) 10 MG tablet, Take 1 tablet (10 mg total) by mouth every morning., Disp: 90 tablet, Rfl: 1 .  pantoprazole (PROTONIX) 40 MG tablet, Take 1 tablet (40 mg total) by mouth daily., Disp: 90 tablet, Rfl: 3 .  potassium chloride (K-DUR) 10 MEQ tablet, Take 2 tablets (20 mEq total) by mouth daily. (Patient taking differently: Take 30 mEq daily by mouth. ), Disp: 180 tablet, Rfl: 3 .  rOPINIRole (REQUIP) 0.5 MG tablet, Take one tablet in the afternoon and two tablets at bedtime, Disp: 90 tablet, Rfl: 3 .  vitamin B-12 (CYANOCOBALAMIN) 1000 MCG tablet, Take 1,000 mcg by mouth daily., Disp: , Rfl:   EXAM:  VITALS per patient if applicable:  GENERAL: alert, oriented, appears well and in no acute distress  HEENT: atraumatic, conjunttiva clear, no obvious abnormalities on inspection of external nose and ears  NECK: normal movements of the head and neck  LUNGS: on inspection no signs of respiratory distress, breathing rate appears normal, no obvious gross SOB, gasping or wheezing  CV: no obvious cyanosis  MS: moves all visible extremities without noticeable abnormality  PSYCH/NEURO: pleasant and cooperative, no obvious depression or anxiety, speech and thought processing grossly intact  ASSESSMENT AND PLAN:  Discussed the following assessment and plan:  Essential hypertension Cont to monitor does not appear to be on just toprol 25 xl and prn lasix 20 mg qd   Spasm of thoracic back muscle - Plan: cyclobenzaprine (FLEXERIL) 5 MG tablet qhs prn  RLS (restless legs syndrome) on requip 0.5 mg qhs   Gastroesophageal reflux disease without esophagitis On protonix 40 mg qd   CAD, mild to moderate AR, mild MR -f/u cards Dr. Josefa Half cont meds    Vertigo -prn meclizine   Moderate persistent asthma, unspecified whether complicated Bronchiectasis without complication ([place]) -started pulmicort neb and duoneb  F/u pulm Dr. Gildardo Pounds   HM Flu shot utd shingrix 2/2  prevnar 08/19/17 pna 23 had consider repeat Q5 years  covid pfizer 06/27/19  Td in 2015 ?  H/o fatty liver per bx 2007 will disc hep A/B in future   Declines further mammograms sister +breast cancer (also my pt [place] Sheer) and would not want tx if were + -she does self breast exam   dexa 02/21/15 osteopenia   Colonoscopy 01/21/16 out of age window, diverticulosis and polyp which bx'ed was collagenous colitis   CT scan h/o lung nodule   Skin   Cardiology sees Dr. Josefa Half h/o mild to moderate AR  and mild MR  Pulm=[facility]  Fall she had 8-9 months from 06/2019 and injured her back and is on flexeril qhs prn   -we discussed possible serious and likely etiologies, options for evaluation and workup, limitations of telemedicine visit vs in person visit, treatment, treatment risks and precautions. Pt prefers to treat via telemedicine empirically rather then risking or undertaking an in person visit at this moment. Patient agrees to seek prompt in person care if worsening, new symptoms arise, or if is not improving with treatment.   I discussed the assessment and treatment plan with the patient. The patient was provided an opportunity to ask questions and all were answered. The patient agreed with the plan and demonstrated an understanding of the instructions.   The patient was advised to call back or seek an in-person evaluation if the symptoms worsen or if the condition fails to improve as anticipated.  Time spent 25 minutes  Delorise Jackson, MD

## 2019-06-28 NOTE — Patient Instructions (Signed)
Nice to meet you today  Someone will call you to schedule a fasting lab appt  Take care and stay safe  Dr. Olivia Mackie McLean-Scocuzza

## 2019-08-09 ENCOUNTER — Other Ambulatory Visit: Payer: PPO

## 2019-08-14 DIAGNOSIS — J479 Bronchiectasis, uncomplicated: Secondary | ICD-10-CM | POA: Diagnosis not present

## 2019-08-23 ENCOUNTER — Other Ambulatory Visit: Payer: Self-pay | Admitting: Internal Medicine

## 2019-08-23 DIAGNOSIS — J309 Allergic rhinitis, unspecified: Secondary | ICD-10-CM

## 2019-08-23 MED ORDER — MONTELUKAST SODIUM 10 MG PO TABS: 10.0000 mg | ORAL_TABLET | ORAL | 3 refills | Status: AC

## 2019-08-24 DIAGNOSIS — J479 Bronchiectasis, uncomplicated: Secondary | ICD-10-CM | POA: Diagnosis not present

## 2019-09-12 ENCOUNTER — Other Ambulatory Visit: Payer: Self-pay | Admitting: Internal Medicine

## 2019-09-12 DIAGNOSIS — E611 Iron deficiency: Secondary | ICD-10-CM

## 2019-09-12 MED ORDER — FERROUS SULFATE 325 (65 FE) MG PO TABS: 325.0000 mg | ORAL_TABLET | Freq: Every day | ORAL | 3 refills | Status: AC

## 2019-09-15 DIAGNOSIS — J479 Bronchiectasis, uncomplicated: Secondary | ICD-10-CM | POA: Diagnosis not present

## 2019-09-23 DIAGNOSIS — J479 Bronchiectasis, uncomplicated: Secondary | ICD-10-CM | POA: Diagnosis not present

## 2019-09-25 DIAGNOSIS — Z1331 Encounter for screening for depression: Secondary | ICD-10-CM | POA: Diagnosis not present

## 2019-09-25 DIAGNOSIS — R05 Cough: Secondary | ICD-10-CM | POA: Diagnosis not present

## 2019-09-27 ENCOUNTER — Other Ambulatory Visit (INDEPENDENT_AMBULATORY_CARE_PROVIDER_SITE_OTHER): Payer: PPO

## 2019-09-27 ENCOUNTER — Telehealth: Payer: Self-pay | Admitting: Internal Medicine

## 2019-09-27 ENCOUNTER — Encounter: Payer: Self-pay | Admitting: Internal Medicine

## 2019-09-27 ENCOUNTER — Other Ambulatory Visit: Payer: Self-pay | Admitting: Internal Medicine

## 2019-09-27 ENCOUNTER — Other Ambulatory Visit: Payer: Self-pay

## 2019-09-27 DIAGNOSIS — E039 Hypothyroidism, unspecified: Secondary | ICD-10-CM

## 2019-09-27 DIAGNOSIS — E559 Vitamin D deficiency, unspecified: Secondary | ICD-10-CM | POA: Diagnosis not present

## 2019-09-27 DIAGNOSIS — R7303 Prediabetes: Secondary | ICD-10-CM

## 2019-09-27 DIAGNOSIS — E538 Deficiency of other specified B group vitamins: Secondary | ICD-10-CM

## 2019-09-27 DIAGNOSIS — I10 Essential (primary) hypertension: Secondary | ICD-10-CM

## 2019-09-27 DIAGNOSIS — D649 Anemia, unspecified: Secondary | ICD-10-CM

## 2019-09-27 LAB — CBC WITH DIFFERENTIAL/PLATELET
Basophils Absolute: 0 10*3/uL (ref 0.0–0.1)
Basophils Relative: 1.2 % (ref 0.0–3.0)
Eosinophils Absolute: 0.1 10*3/uL (ref 0.0–0.7)
Eosinophils Relative: 2.5 % (ref 0.0–5.0)
HCT: 35.1 % — ABNORMAL LOW (ref 36.0–46.0)
Hemoglobin: 11.9 g/dL — ABNORMAL LOW (ref 12.0–15.0)
Lymphocytes Relative: 37.7 % (ref 12.0–46.0)
Lymphs Abs: 1.5 10*3/uL (ref 0.7–4.0)
MCHC: 34 g/dL (ref 30.0–36.0)
MCV: 100.6 fl — ABNORMAL HIGH (ref 78.0–100.0)
Monocytes Absolute: 0.5 10*3/uL (ref 0.1–1.0)
Monocytes Relative: 11.4 % (ref 3.0–12.0)
Neutro Abs: 1.9 10*3/uL (ref 1.4–7.7)
Neutrophils Relative %: 47.2 % (ref 43.0–77.0)
Platelets: 254 10*3/uL (ref 150.0–400.0)
RBC: 3.49 Mil/uL — ABNORMAL LOW (ref 3.87–5.11)
RDW: 14.1 % (ref 11.5–15.5)
WBC: 4 10*3/uL (ref 4.0–10.5)

## 2019-09-27 LAB — COMPREHENSIVE METABOLIC PANEL
ALT: 9 U/L (ref 0–35)
AST: 12 U/L (ref 0–37)
Albumin: 3.9 g/dL (ref 3.5–5.2)
Alkaline Phosphatase: 78 U/L (ref 39–117)
BUN: 11 mg/dL (ref 6–23)
CO2: 34 mEq/L — ABNORMAL HIGH (ref 19–32)
Calcium: 9.1 mg/dL (ref 8.4–10.5)
Chloride: 99 mEq/L (ref 96–112)
Creatinine, Ser: 0.61 mg/dL (ref 0.40–1.20)
GFR: 93.7 mL/min (ref >60.00)
Glucose, Bld: 103 mg/dL — ABNORMAL HIGH (ref 70–99)
Potassium: 4.3 mEq/L (ref 3.5–5.1)
Sodium: 135 mEq/L (ref 135–145)
Total Bilirubin: 0.4 mg/dL (ref 0.2–1.2)
Total Protein: 6.1 g/dL (ref 6.0–8.3)

## 2019-09-27 LAB — LIPID PANEL
Cholesterol: 158 mg/dL (ref 0–200)
HDL: 35.8 mg/dL — ABNORMAL LOW (ref >39.00)
LDL Cholesterol: 102 mg/dL — ABNORMAL HIGH (ref 0–99)
NonHDL: 122.53
Total CHOL/HDL Ratio: 4
Triglycerides: 102 mg/dL (ref 0.0–149.0)
VLDL: 20.4 mg/dL (ref 0.0–40.0)

## 2019-09-27 LAB — VITAMIN D 25 HYDROXY (VIT D DEFICIENCY, FRACTURES): VITD: 26.64 ng/mL — ABNORMAL LOW (ref 30.00–100.00)

## 2019-09-27 LAB — TSH: TSH: 23.15 u[IU]/mL — ABNORMAL HIGH (ref 0.35–4.50)

## 2019-09-27 LAB — VITAMIN B12: Vitamin B-12: 226 pg/mL (ref 211–911)

## 2019-09-27 LAB — HEMOGLOBIN A1C: Hgb A1c MFr Bld: 5.5 % (ref 4.6–6.5)

## 2019-09-27 MED ORDER — LEVOTHYROXINE SODIUM 150 MCG PO TABS: 150.0000 ug | ORAL_TABLET | Freq: Every day | ORAL | 3 refills | Status: AC

## 2019-09-27 NOTE — Telephone Encounter (Signed)
Review with pt below  Increase thyroid med to 150?  Does she want to order thyroid ultrasound?   ----- Message from Delorise Jackson, MD sent at 09/27/2019  4:53 PM EDT ----- Cholesterol slightly improved Liver kidneys normal  A1C no prediabetes Vitamin D low rec D3 over the counter 4000 IU daily B12 low normal rec B12 1000 Mcg daily over the counter   TSH significantly elevated  -is she missing doses of meds?  -do not mix with supplements take in the am 30 min before food  -agreeable to increase dose?  -agreeable to thyroid US?   Sch in person visit in 8 weeks ?        Documentation   Deborah Deleon, Deborah Deleon  You 18 minutes ago (4:58 PM)   How should up dosage, have not missed a dose. Do take thirty to sixty minutes before any other meds or food.

## 2019-09-27 NOTE — Telephone Encounter (Signed)
-----   Message from Delorise Jackson, MD sent at 09/27/2019  4:53 PM EDT ----- Cholesterol slightly improved Liver kidneys normal  A1C no prediabetes Vitamin D low rec D3 over the counter 4000 IU daily B12 low normal rec B12 1000 Mcg daily over the counter   TSH significantly elevated  -is she missing doses of meds?  -do not mix with supplements take in the am 30 min before food  -agreeable to increase dose?  -agreeable to thyroid US?   Sch in person visit in 8 weeks ?

## 2019-09-28 ENCOUNTER — Other Ambulatory Visit: Payer: Self-pay | Admitting: Internal Medicine

## 2019-09-28 DIAGNOSIS — J309 Allergic rhinitis, unspecified: Secondary | ICD-10-CM

## 2019-09-28 LAB — IRON,TIBC AND FERRITIN PANEL
%SAT: 25 % (calc) (ref 16–45)
Ferritin: 136 ng/mL (ref 16–288)
Iron: 64 ug/dL (ref 45–160)
TIBC: 260 mcg/dL (calc) (ref 250–450)

## 2019-09-28 MED ORDER — FLUTICASONE PROPIONATE 50 MCG/ACT NA SUSP: 2.0000 | Freq: Every day | NASAL | 11 refills | Status: AC

## 2019-10-02 NOTE — Telephone Encounter (Signed)
Patient informed and verbalized understanding.  She is agreeable to increased dosage and also to an ultrasound.

## 2019-10-04 ENCOUNTER — Other Ambulatory Visit: Payer: Self-pay | Admitting: Internal Medicine

## 2019-10-04 DIAGNOSIS — R946 Abnormal results of thyroid function studies: Secondary | ICD-10-CM

## 2019-10-13 ENCOUNTER — Ambulatory Visit: Payer: PPO

## 2019-10-18 ENCOUNTER — Other Ambulatory Visit: Payer: Self-pay

## 2019-10-18 DIAGNOSIS — R0602 Shortness of breath: Secondary | ICD-10-CM | POA: Diagnosis not present

## 2019-10-18 DIAGNOSIS — I351 Nonrheumatic aortic (valve) insufficiency: Secondary | ICD-10-CM | POA: Diagnosis not present

## 2019-10-18 DIAGNOSIS — E785 Hyperlipidemia, unspecified: Secondary | ICD-10-CM | POA: Diagnosis not present

## 2019-10-18 DIAGNOSIS — I340 Nonrheumatic mitral (valve) insufficiency: Secondary | ICD-10-CM | POA: Diagnosis not present

## 2019-10-18 DIAGNOSIS — I10 Essential (primary) hypertension: Secondary | ICD-10-CM | POA: Diagnosis not present

## 2019-10-20 ENCOUNTER — Ambulatory Visit (INDEPENDENT_AMBULATORY_CARE_PROVIDER_SITE_OTHER): Payer: PPO | Admitting: Internal Medicine

## 2019-10-20 ENCOUNTER — Other Ambulatory Visit: Payer: Self-pay

## 2019-10-20 ENCOUNTER — Encounter: Payer: Self-pay | Admitting: Internal Medicine

## 2019-10-20 VITALS — BP 100/60 | HR 67 | Temp 97.1°F | Ht 65.0 in | Wt 171.8 lb

## 2019-10-20 DIAGNOSIS — R011 Cardiac murmur, unspecified: Secondary | ICD-10-CM

## 2019-10-20 DIAGNOSIS — E039 Hypothyroidism, unspecified: Secondary | ICD-10-CM

## 2019-10-20 NOTE — Progress Notes (Signed)
Chief Complaint  Patient presents with  . Follow-up   F/u moving to [facility] near [place] 10/21/19  1. hypothyroidisim uncontrolled had since 85 y.o she was compliant with meds on levo 125 and TSH now on levo 150 mcg  Results for Deborah Deleon, Deborah Deleon (MRN [id number]) as of 10/20/2019 13:29  09/27/2019 08:51 TSH: 23.15 (H)   Review of Systems  Constitutional: Negative for weight loss.  HENT: Negative for hearing loss.   Eyes: Negative for blurred vision.  Respiratory: Negative for shortness of breath.   Cardiovascular: Negative for chest pain.  Gastrointestinal: Negative for abdominal pain.  Musculoskeletal: Negative for falls.  Skin: Negative for rash.  Neurological: Negative for headaches.  Psychiatric/Behavioral: Negative for depression.   Past Medical History:  Diagnosis Date  . Allergy   . Cataract cortical, senile   . Celiac disease   . Chicken pox   . Collagenous colitis   . Degenerative joint disease   . Diverticulosis   . Family history of adverse reaction to anesthesia    daughter climbs the walls with anesthesia  . GERD (gastroesophageal reflux disease)   . Hypertension   . Hypothyroidism   . Restless leg    Past Surgical History:  Procedure Laterality Date  . ABDOMINAL HYSTERECTOMY  1971  . APPENDECTOMY  1958  . CATARACT EXTRACTION W/ INTRAOCULAR LENS IMPLANT Bilateral 2010   left done April and right done in May  . CHOLECYSTECTOMY  1979  . COLONOSCOPY WITH PROPOFOL N/A 01/21/2016   Procedure: COLONOSCOPY WITH PROPOFOL;  Surgeon: Lollie Sails, MD;  Location: [facility] ENDOSCOPY;  Service: Endoscopy;  Laterality: N/A;  . KNEE ARTHROPLASTY Left 04/14/2017   Procedure: COMPUTER ASSISTED TOTAL KNEE ARTHROPLASTY;  Surgeon: Dereck Leep, MD;  Location: [facility] ORS;  Service: Orthopedics;  Laterality: Left;  . KNEE ARTHROSCOPY Left 02/17/2016   Procedure: ARTHROSCOPY KNEE Chondraplasty;  Surgeon: Dereck Leep, MD;  Location: [facility] ORS;  Service: Orthopedics;   Laterality: Left;  . ROTATOR CUFF REPAIR Left 2008  . TONSILLECTOMY     Family History  Problem Relation Age of Onset  . Breast cancer Sister   . Heart disease Mother   . Diabetes Mother   . Lupus Mother   . Arthritis Father   . Heart disease Father   . Aneurysm Father   . Heart disease Daughter        heart surgery   Social History   Socioeconomic History  . Marital status: Widowed    Spouse name: Not on file  . Number of children: Not on file  . Years of education: Not on file  . Highest education level: Not on file  Occupational History  . Not on file  Tobacco Use  . Smoking status: Never Smoker  . Smokeless tobacco: Never Used  Substance and Sexual Activity  . Alcohol use: No  . Drug use: No  . Sexual activity: Not Currently  Other Topics Concern  . Not on file  Social History Narrative   Lives at [facility] of [place] great relative as of 06/2019 who is 2.5 months old on tues/thurs   5 kids living, 3 kids deceased    Studied in the past fitting prosthesis    Enjoys church   Widowed    As of 10/20/19 moving to [facility]    Social Determinants of Health   Financial Resource Strain: Low Risk   . Difficulty of Paying Living Expenses: Not  hard at all  Food Insecurity: No Food Insecurity  . Worried About Charity fundraiser in the Last Year: Never true  . Ran Out of Food in the Last Year: Never true  Transportation Needs: No Transportation Needs  . Lack of Transportation (Medical): No  . Lack of Transportation (Non-Medical): No  Physical Activity: Insufficiently Active  . Days of Exercise per Week: 3 days  . Minutes of Exercise per Session: 30 min  Stress: No Stress Concern Present  . Feeling of Stress : Not at all  Social Connections:   . Frequency of Communication with Friends and Family:   . Frequency of Social Gatherings with Friends and Family:   . Attends Religious Services:   . Active Member of Clubs or  Organizations:   . Attends Archivist Meetings:   Marland Kitchen Marital Status:   Intimate Partner Violence:   . Fear of Current or Ex-Partner:   . Emotionally Abused:   Marland Kitchen Physically Abused:   . Sexually Abused:    Current Meds  Medication Sig  . acetaminophen (TYLENOL) 325 MG tablet Take 650 mg by mouth every 6 (six) hours as needed.  Marland Kitchen aspirin EC 81 MG tablet Take 81 mg 2 (two) times daily by mouth.   . budesonide (PULMICORT) 0.25 MG/2ML nebulizer solution Take 0.25 mg by nebulization daily.   . Cholecalciferol (VITAMIN D3 PO) Take by mouth.  . cyclobenzaprine (FLEXERIL) 5 MG tablet Take 1 tablet (5 mg total) by mouth daily as needed for muscle spasms. RLS  . ferrous sulfate 325 (65 FE) MG tablet Take 1 tablet (325 mg total) by mouth daily with lunch. Wait 4 hours after thyroid medication  . fluticasone (FLONASE) 50 MCG/ACT nasal spray Place 2 sprays into both nostrils daily. As needed  . furosemide (LASIX) 20 MG tablet Take 20 mg by mouth.  . levothyroxine (SYNTHROID) 150 MCG tablet Take 1 tablet (150 mcg total) by mouth daily before breakfast. 30 minutes  . meclizine (ANTIVERT) 25 MG tablet Take 1 tablet (25 mg total) by mouth 3 (three) times daily as needed for dizziness.  . metoprolol succinate (TOPROL-XL) 25 MG 24 hr tablet Take 25 mg daily by mouth.  . montelukast (SINGULAIR) 10 MG tablet Take 1 tablet (10 mg total) by mouth every morning.  . pantoprazole (PROTONIX) 40 MG tablet Take 1 tablet (40 mg total) by mouth daily.  . potassium chloride (K-DUR) 10 MEQ tablet Take 2 tablets (20 mEq total) by mouth daily. (Patient taking differently: Take 30 mEq daily by mouth. )  . rOPINIRole (REQUIP) 0.5 MG tablet Take one tablet in the afternoon and two tablets at bedtime  . vitamin B-12 (CYANOCOBALAMIN) 1000 MCG tablet Take 1,000 mcg by mouth daily.   Allergies  Allergen Reactions  . Gabapentin Diarrhea    Severe diarrhea  . Penicillins Swelling    Has patient had a PCN reaction  causing immediate rash, facial/tongue/throat swelling, SOB or lightheadedness with hypotension: Yes Has patient had a PCN reaction causing severe rash involving mucus membranes or skin necrosis: No Has patient had a PCN reaction that required hospitalization: No Has patient had a PCN reaction occurring within the last 10 years: No If all of the above answers are "NO", then may proceed with Cephalosporin use.   . Sulfa Antibiotics Rash   Recent Results (from the past 2160 hour(s))  Iron, TIBC and Ferritin Panel     Status: None   Collection Time: 09/27/19  8:51 AM  Result Value  Ref Range   Iron 64 45 - 160 mcg/dL   TIBC 260 250 - 450 mcg/dL (calc)   %SAT 25 16 - 45 % (calc)   Ferritin 136 16 - 288 ng/mL  Vitamin D (25 hydroxy)     Status: Abnormal   Collection Time: 09/27/19  8:51 AM  Result Value Ref Range   VITD 26.64 (L) 30.00 - 100.00 ng/mL  B12     Status: None   Collection Time: 09/27/19  8:51 AM  Result Value Ref Range   Vitamin B-12 226 211 - 911 pg/mL  Hemoglobin A1c     Status: None   Collection Time: 09/27/19  8:51 AM  Result Value Ref Range   Hgb A1c MFr Bld 5.5 4.6 - 6.5 %    Comment: Glycemic Control Guidelines for People with Diabetes:Non Diabetic:  <6%Goal of Therapy: <7%Additional Action Suggested:  >8%   TSH     Status: Abnormal   Collection Time: 09/27/19  8:51 AM  Result Value Ref Range   TSH 23.15 (H) 0.35 - 4.50 uIU/mL  Lipid panel     Status: Abnormal   Collection Time: 09/27/19  8:51 AM  Result Value Ref Range   Cholesterol 158 0 - 200 mg/dL    Comment: ATP III Classification       Desirable:  < 200 mg/dL               Borderline High:  200 - 239 mg/dL          High:  > = 240 mg/dL   Triglycerides 102.0 0.0 - 149.0 mg/dL    Comment: Normal:  <150 mg/dLBorderline High:  150 - 199 mg/dL   HDL 35.80 (L) >39.00 mg/dL   VLDL 20.4 0.0 - 40.0 mg/dL   LDL Cholesterol 102 (H) 0 - 99 mg/dL   Total CHOL/HDL Ratio 4     Comment:                Men           Women1/2 Average Risk     3.4          3.3Average Risk          5.0          4.42X Average Risk          9.6          7.13X Average Risk          15.0          11.0                       NonHDL 122.53     Comment: NOTE:  Non-HDL goal should be 30 mg/dL higher than patient's LDL goal (i.e. LDL goal of < 70 mg/dL, would have non-HDL goal of < 100 mg/dL)  CBC with Differential/Platelet     Status: Abnormal   Collection Time: 09/27/19  8:51 AM  Result Value Ref Range   WBC 4.0 4.0 - 10.5 K/uL   RBC 3.49 (L) 3.87 - 5.11 Mil/uL   Hemoglobin 11.9 (L) 12.0 - 15.0 g/dL   HCT 35.1 (L) 36.0 - 46.0 %   MCV 100.6 (H) 78.0 - 100.0 fl   MCHC 34.0 30.0 - 36.0 g/dL   RDW 14.1 11.5 - 15.5 %   Platelets 254.0 150.0 - 400.0 K/uL   Neutrophils Relative % 47.2 43.0 - 77.0 %   Lymphocytes Relative 37.7 12.0 -  46.0 %   Monocytes Relative 11.4 3.0 - 12.0 %   Eosinophils Relative 2.5 0.0 - 5.0 %   Basophils Relative 1.2 0.0 - 3.0 %   Neutro Abs 1.9 1.4 - 7.7 K/uL   Lymphs Abs 1.5 0.7 - 4.0 K/uL   Monocytes Absolute 0.5 0.1 - 1.0 K/uL   Eosinophils Absolute 0.1 0.0 - 0.7 K/uL   Basophils Absolute 0.0 0.0 - 0.1 K/uL  Comprehensive metabolic panel     Status: Abnormal   Collection Time: 09/27/19  8:51 AM  Result Value Ref Range   Sodium 135 135 - 145 mEq/L   Potassium 4.3 3.5 - 5.1 mEq/L   Chloride 99 96 - 112 mEq/L   CO2 34 (H) 19 - 32 mEq/L   Glucose, Bld 103 (H) 70 - 99 mg/dL   BUN 11 6 - 23 mg/dL   Creatinine, Ser 0.61 0.40 - 1.20 mg/dL   Total Bilirubin 0.4 0.2 - 1.2 mg/dL   Alkaline Phosphatase 78 39 - 117 U/L   AST 12 0 - 37 U/L   ALT 9 0 - 35 U/L   Total Protein 6.1 6.0 - 8.3 g/dL   Albumin 3.9 3.5 - 5.2 g/dL   GFR 93.70 >60.00 mL/min   Calcium 9.1 8.4 - 10.5 mg/dL   Objective  Body mass index is 28.59 kg/m. Wt Readings from Last 3 Encounters:  10/20/19 171 lb 12.8 oz (77.9 kg)  06/28/19 171 lb (77.6 kg)  04/05/19 168 lb (76.2 kg)   Temp Readings from Last 3 Encounters:  10/20/19 (!)  97.1 F (36.2 C) (Temporal)  04/05/19 97.7 F (36.5 C)  01/09/19 97.7 F (36.5 C) (Temporal)   BP Readings from Last 3 Encounters:  10/20/19 100/60  06/28/19 138/72  04/05/19 122/62   Pulse Readings from Last 3 Encounters:  10/20/19 67  04/05/19 61  01/26/19 76    Physical Exam Vitals and nursing note reviewed.  Constitutional:      Appearance: Normal appearance. She is well-developed.  HENT:     Head: Normocephalic and atraumatic.  Eyes:     Conjunctiva/sclera: Conjunctivae normal.     Pupils: Pupils are equal, round, and reactive to light.  Cardiovascular:     Rate and Rhythm: Normal rate and regular rhythm.     Heart sounds: Murmur present.  Pulmonary:     Effort: Pulmonary effort is normal.     Breath sounds: Normal breath sounds.  Skin:    General: Skin is warm and dry.  Neurological:     General: No focal deficit present.     Mental Status: She is alert and oriented to person, place, and time. Mental status is at baseline.     Gait: Gait normal.  Psychiatric:        Attention and Perception: Attention and perception normal.        Mood and Affect: Mood and affect normal.        Speech: Speech normal.        Behavior: Behavior normal. Behavior is cooperative.        Thought Content: Thought content normal.        Cognition and Memory: Cognition and memory normal.        Judgment: Judgment normal.     Assessment  Plan  Hypothyroidism, unspecified type - Plan: TSH Results for Deborah Deleon, Deborah Deleon (MRN [id number]) as of 10/20/2019 13:29  Ref. Range 09/27/2019 08:51  TSH Latest Ref Range: 0.35 - 4.50 uIU/mL 23.15 (H)  Consider thyroid US if TSH still abnormal she will consider in [facility] Repeat TSH 11/22/19 [facility] form given today   HM Flu shot utd shingrix 2/2  prevnar 08/19/17 pna 23 had 01/08/13  covid pfizer 06/27/19 missed 2nd shot out of supply at pharmacy will need to restart series in 2-3 months per pt Td in 08/16/13 H/o fatty liver per bx 2007 will disc hep  A/B in future   Declines further mammograms sister +breast cancer (also my pt [place]) and would not want tx if were + -she does self breast exam  FH breast cancer in sister   dexa 02/21/15 osteopenia   Colonoscopy 01/21/16 out of age window, diverticulosis and polyp which bx'ed was collagenous colitis   CT scan h/o lung nodule   Skin seen Dr. Nehemiah Massed  Summer 2020 f/u in 2 years rec but as of 10/20/19 I rec derm due to aks to arms  Cardiology sees Dr. Josefa Half h/o mild to moderate AR and mild MR  Pulm=[facility] pulm  Fall she had 8-9 months from 06/2019 and injured her back and is on flexeril qhs prn  Last echo 01/2019 given copy today  02/13/2019  [place], [place]                  [place] #: [id number]      [place], [place], [place] [address]    Date: 02/13/2019 01: 67 PM                                Adult  Female  Age: 89 yrs      ECHOCARDIOGRAM REPORT               Outpatient                                [facility]    STUDY:CHEST WALL        TAPE:0000: 00: 0: 00: 00 MD1: Paraschos, MD Alex    ECHO:Yes  DOPPLER:Yes    FILE:[id number]    BP: 102/60 mmHg    COLOR:Yes  CONTRAST:No   MACHINE:Philips  RV BIOPSY:No     3D:No SOUND QLTY:Moderate      Height: 63 in   MEDIUM:None                       Weight: 167 lb                                BSA: 1.8 m2 _________________________________________________________________________________________        HISTORY: DOE, Valvular disease         REASON: Assess, LV function       INDICATION: I25.10 Atherosclerotic heart disease of native coronary artery,             R06.02  Shortness of breath, I35.1 Nonrheumatic aortic (valve)             insufficiency, I34.0 Nonrheumatic mitral (valve) insufficiency _________________________________________________________________________________________ ECHOCARDIOGRAPHIC MEASUREMENTS 2D DIMENSIONS AORTA         Values  Normal Range  MAIN PA  Values  Normal Range        Annulus: nm*     [2.1-2.5]     PA Main: nm*    [1.5-2.1]       Aorta Sin: 3.0 cm    [2.7-3.3]  RIGHT VENTRICLE      ST Junction: nm*     [2.3-2.9]     RV Base: 3.2 cm  [<4.2]       Asc.Aorta: nm*     [2.3-3.1]     RV Mid: nm*    [<3.5] LEFT VENTRICLE                   RV Length: nm*    [<8.6]         LVIDd: 5.2 cm    [3.9-5.3]  INFERIOR VENA CAVA         LVIDs: 3.8 cm            Max. IVC: nm*    [<=2.1]           FS: 26.8 %    [>25]      Min. IVC: nm*          SWT: 0.95 cm   [0.5-0.9]  ------------------          PWT: 0.98 cm   [0.5-0.9]  nm* - not measured LEFT ATRIUM        LA Diam: 4.4 cm    [2.7-3.8]      LA A4C Area: nm*     [<20]       LA Volume: nm*     [22-52] _________________________________________________________________________________________ ECHOCARDIOGRAPHIC DESCRIPTIONS AORTIC ROOT          Size: Normal       Dissection: INDETERM FOR DISSECTION AORTIC VALVE        Leaflets: Tricuspid          Morphology: Normal        Mobility: Fully mobile LEFT VENTRICLE          Size: Normal            Anterior: Normal      Contraction: Normal             Lateral: Normal       Closest EF: >55% (Estimated)        Septal: Normal       LV Masses: No Masses            Apical: Normal          LVH: None              Inferior: Normal                           Posterior: Normal      Dias.FxClass: (Grade 1) relaxation abnormal, E/A reversal MITRAL VALVE        Leaflets: Normal            Mobility: Fully mobile       Morphology: ANNULAR CALC LEFT ATRIUM          Size: MILDLY ENLARGED       LA Masses: No masses       IA Septum: Normal IAS MAIN PA          Size: Normal PULMONIC VALVE       Morphology: Normal            Mobility: Fully mobile RIGHT VENTRICLE  RV Masses: No Masses             Size: Normal       Free Wall: Normal           Contraction: Normal TRICUSPID VALVE        Leaflets: Normal            Mobility: Fully mobile       Morphology: Normal RIGHT ATRIUM          Size: MILDLY ENLARGED        RA Other: None        RA Mass: No masses PERICARDIUM         Fluid: No effusion INFERIOR VENACAVA          Size: Normal Normal respiratory collapse _________________________________________________________________________________________  DOPPLER ECHO and OTHER SPECIAL PROCEDURES         Aortic: TRIVIAL AR         No AS             135.7 cm/sec peak vel   7.4 mmHg peak grad             3.8 mmHg mean grad     1.9 cm^2 by DOPPLER         Mitral: MODERATE MR        No MS             MV Inflow E Vel = 68.1 cm/sec   MV Annulus E'Vel = 4.8 cm/sec             E/E'Ratio = 14.2       Tricuspid: MILD TR          No TS             295.9 cm/sec peak TR vel  40.0 mmHg peak RV pressure       Pulmonary: TRIVIAL PR         No PS _________________________________________________________________________________________ INTERPRETATION NORMAL LEFT VENTRICULAR SYSTOLIC FUNCTION WITH AN  ESTIMATED EF = >55 % NORMAL RIGHT VENTRICULAR SYSTOLIC FUNCTION MODERATE MITRAL VALVE INSUFFICIENCY MILD TRICUSPID VALVE INSUFFICIENCY TRACE AORTIC VALVE INSUFFICIENCY NO VALVULAR STENOSIS MILD BIATRIAL ENLARGEMENT _________________________________________________________________________________________ Electronically signed by   MD Miquel Dunn on 02/13/2019 03: 30 PM      Performed By: Cephus Shelling, RVT   Ordering Physician: Clabe Seal Provider: Dr. Olivia Mackie McLean-Scocuzza-Internal Medicine

## 2019-10-20 NOTE — Patient Instructions (Addendum)
TSH lab due 11/22/19 Hold thyroid ultrasound until [place]  ECHOCARDIOGRAPHIC DESCRIPTIONS AORTIC ROOT                  Size: Normal            Dissection: INDETERM FOR DISSECTION AORTIC VALVE              Leaflets: Tricuspid                   Morphology: Normal              Mobility: Fully mobile LEFT VENTRICLE                  Size: Normal                        Anterior: Normal           Contraction: Normal                         Lateral: Normal            Closest EF: >55% (Estimated)                Septal: Normal             LV Masses: No Masses                       Apical: Normal                   LVH: None                          Inferior: Normal                                                     Posterior: Normal          Dias.FxClass: (Grade 1) relaxation abnormal, E/A reversal MITRAL VALVE              Leaflets: Normal                        Mobility: Fully mobile            Morphology: ANNULAR CALC LEFT ATRIUM                  Size: MILDLY ENLARGED              LA Masses: No masses             IA Septum: Normal IAS MAIN PA                  Size: Normal PULMONIC VALVE            Morphology: Normal                        Mobility: Fully mobile RIGHT VENTRICLE             RV Masses: No Masses                         Size: Normal  Free Wall: Normal                     Contraction: Normal TRICUSPID VALVE              Leaflets: Normal                        Mobility: Fully mobile            Morphology: Normal RIGHT ATRIUM                  Size: MILDLY ENLARGED               RA Other: None               RA Mass: No masses PERICARDIUM                 Fluid: No effusion INFERIOR VENACAVA                  Size: Normal Normal respiratory collapse _________________________________________________________________________________________  DOPPLER ECHO and OTHER SPECIAL PROCEDURES                Aortic: TRIVIAL AR                 No AS                        135.7  cm/sec peak vel      7.4 mmHg peak grad                        3.8 mmHg mean grad         1.9 cm^2 by DOPPLER                Mitral: MODERATE MR                No MS                        MV Inflow E Vel = 68.1 cm/sec     MV Annulus E'Vel = 4.8 cm/sec                        E/E'Ratio = 14.2             Tricuspid: MILD TR                    No TS                        295.9 cm/sec peak TR vel   40.0 mmHg peak RV pressure             Pulmonary: TRIVIAL PR                 No PS _________________________________________________________________________________________ INTERPRETATION NORMAL LEFT VENTRICULAR SYSTOLIC FUNCTION WITH AN ESTIMATED EF = >55 % NORMAL RIGHT VENTRICULAR SYSTOLIC FUNCTION MODERATE MITRAL VALVE INSUFFICIENCY MILD TRICUSPID VALVE INSUFFICIENCY TRACE AORTIC VALVE INSUFFICIENCY NO VALVULAR STENOSIS MILD BIATRIAL ENLARGEMENT _________________________________________________________________________________________ Electronically signed by      MD Miquel Dunn on 02/13/2019 03: 33 PM          Performed By: Johnathan Hausen, RDCS, RVT    Ordering Physician: Clabe Seal  01/27/18 right knee Xray  Imaging: AP lateral and sunrise views of the right knee are  ordered and  interpreted by me in the office today. Impression: Patient has  degenerative changes in the lateral compartment with slight valgus  deformity. There is spurring along the lateral femoral condyle. Moderate  subchondral changes noted along the lateral tibial plateau. Mild joint  space narrowing noted in the medial compartment with milder subchondral  changes noted along the medial tibial plateau. Patella has mild  chondromalacia present along the lateral patella facet. Patella tracks  well. No evidence of acute bony abnormality or effusion.  01/27/18 left knee Xray Imaging: AP lateral and sunrise views of the left knee are ordered  interpreted by me in the office today. Impression: Patient has underwent   left total knee arthroplasty with good alignment of the prosthesis.  Tibial and femoral components are intact with no evidence of loosening or  subsidence. No changes when compared to previous x-rays. Patella is  tracking well. No sign of effusion.  Status   MRN: [id number]           Date of Birth: [birth year] Admit Type: Outpatient        Age: 85 Room: [facility] GI PROCEDURES 03    Gender: Female Note Status: Finalized        Instrument Name: [id number] _______________________________________________________________________________  Procedure:     Upper GI endoscopy Indications:    Dysphagia, Preoperative assessment Providers:     Marcial Pacas, MD, TIFFANY T. GREEN, Cecilie Kicks,            Technician Referring MD:   Jhonnie Garner, MD (Referring MD) Medicines:     Propofol per Anesthesia Complications:   No immediate complications. _______________________________________________________________________________ Procedure:     Pre-Anesthesia Assessment:           - Prior to the procedure, a History and Physical was            performed, and patient medications and allergies were            reviewed. The patient's tolerance of previous anesthesia            was also reviewed. The risks and benefits of the procedure            and the sedation options and risks were discussed with the            patient. All questions were answered, and informed consent            was obtained. Prior Anticoagulants: The patient has taken            aspirin, last dose was day of procedure. ASA Grade            Assessment: III - A patient with severe systemic disease.            After reviewing the risks and benefits, the patient was            deemed in satisfactory condition to undergo the procedure.            After obtaining informed consent, the endoscope was passed            under direct vision. Throughout the procedure, the            patient's blood pressure, pulse, and oxygen saturations            were monitored continuously. The scope was introduced            through the mouth, and advanced to the second part of  duodenum. The upper GI endoscopy was accomplished without            difficulty. The patient tolerated the procedure well.                                          Findings:    A 3 cm hiatal hernia was present (37-40 cm).    Two columns of grade II varices were found in the middle third of the     esophagus.    The exam of the esophagus was otherwise normal.    Multiple small sessile polyps with no bleeding and no stigmata of recent     bleeding were found in the gastric body. Biopsies were taken with a cold     forceps for histology.    Patchy mildly erythematous mucosa without bleeding was found in the     gastric body and in the gastric antrum. Biopsies were taken with a cold     forceps for histology.    The exam of the stomach was otherwise normal.    The examined duodenum was normal.                                          Moderate Sedation:    Propofol per Anesthesia. Impression:    - 3 cm hiatal hernia.           - Grade II esophageal varices.           - Multiple gastric polyps. Biopsied.           - Erythematous mucosa in the gastric body and antrum.            Biopsied.           - Normal examined duodenum. Recommendation:  - Patient has a contact number available for emergencies.            The signs and symptoms of potential delayed complications            were discussed with the patient. Return to normal             activities tomorrow. Written discharge instructions were            provided to the patient.           - Continue present medications.           - Resume previous diet today.           - Await pathology results.           - Return to referring physician as previously scheduled.                                          Procedure Code(s): --- Professional ---           [phone], Esophagogastroduodenoscopy, flexible, transoral;            with biopsy, single or multiple Diagnosis Code(s): --- Professional ---           K44.9, Diaphragmatic hernia without obstruction or gangrene           I85.00, Esophageal varices without bleeding  K31.7, Polyp of stomach and duodenum           K31.89, Other diseases of stomach and duodenum           R13.10, Dysphagia, unspecified           Z01.818, Encounter for other preprocedural examination  CPT copyright 2017 [employer]. All rights reserved.  The codes documented in this report are preliminary and upon coder review may  be revised to meet current compliance requirements.  Electronically Signed By Marcial Pacas, MD _________________ Marcial Pacas, MD 05/27/2017 12:39:12 PM The attending physician was present throughout the entire procedure including  insertion, viewing, and removal. Number of Addenda:   MRI ab 2006  MEDICAL RECORD NUMBER:      [id number]  01/08/05                                         MRI OF THE ABDOMEN AT 0711 HOURS  DICTATED:   01/08/05                 INDICATION:                85 year-old female with newly diagnosed celiac sprue and hepatic steatosis. Assess portal vein patency.  COMPARISON:       None.  TECHNIQUE:                Multiplanar, multisequence MR images were obtained of the abdomen before and after the  intravenous administration of gadolinium per a routine liver protocol.  FINDINGS:                The liver has normal morphology and no ascites is seen. An approximate five-millimeter increased area of T2 signal is seen in the region of segment 6 or 7 with no T1 correlate.  No enhancing liver lesion is seen. The portal vein, splenic vein, superior mesenteric vein and hepatic veins are patent.  The spleen, kidneys, adrenal glands, pancreas, stomach and visualized bowel are unremarkable. The gallbladder is absent.  On out of phase imaging, the liver demonstrates isointensity with the spleen and decreased signal from in-phase imaging.  No varices are seen. The soft tissue structures are unremarkable.   The osseous structures demonstrate no worrisome lesions. A dextroconvexity thoracolumbar scoliosis is seen.  The lung bases are clear.  IMPRESSION:          Hepatic steatosis. A small region of increased T2 signal likely represents a cyst versus a biliary hamartoma. No enhancing liver lesions are seen. No evidence for portal hypertension is seen.  02/21/15 D[facility] [place] Report    REFERRING [id number] [place]  TECHNICIAN:Lori ARobinson HISTORY:FOLLOW UP BONE DENSITY. 85 YEAR OLD WHITE FEMALE.  SITE DATE BMD g/cm2  T-SCORE Z-SCORE g/cm2 CHANGE % CHANGESTATISTICALLY SIGNIFICANT?  LUMBAR SPINE L1- L4 11/26/09 0.871 -1.6    02/21/15 0.843 -1.9-0.028 -3.2% NO  HIP L.FEM.NECK 11/26/09 0.701 -1.3    02/21/15 0.681 -1.5-0.020 -2.8% NO   L.TOTAL HIP 11/26/09 0.862 -0.7    02/21/15 0.844 -0.5-0.018 -2.1% NO  FOREARM  L/R 33%   INTERPRETATION: Osteopenia/Low bone mass .No significant change from 11/2009.   FRACTURE RISK ASSESSMENT (FRAX) _2.7____10 year risk of hip fracture.__12___10 year risk of any  major fracture.   TREATMENT:  The National  Osteoporosis Foundation (2008) recommends treatment for:  Patients with hip or vertebral fracture (clinical or morphometric)  Patients with osteoporosis as defined by T score < = -2.5 Postmenopausal women or men age 30 and older with low bone mass (T score  -1.0 to -2.5, osteopenia) at the femoral neck, total hip, or spine and 10  year hip fracture risk probability >3% or a 10 year all major osteoporosis  related fracture probability of >20%. BMD should be monitored two years after initiating therapy and at two-year  intervals thereafter.  _______________________________________ G. Fayrene Fearing., MD, Julious Payer, [place] Certified Clinical Densitometrist  [facility] [place] is a [facility] QDR 4500. [facility] facility and technologist Least Significant Change [employer]): TechnologistSite LS Spine Site Hip S G 0.031 g/cm20.040 g/cm2 L R0.054 g/cm2 0.033 g/cm2 at 95% confidence level

## 2019-12-12 ENCOUNTER — Other Ambulatory Visit: Payer: Self-pay | Admitting: Internal Medicine

## 2019-12-12 DIAGNOSIS — G2581 Restless legs syndrome: Secondary | ICD-10-CM

## 2019-12-12 MED ORDER — ROPINIROLE HCL 0.5 MG PO TABS: ORAL_TABLET | ORAL | 3 refills | Status: AC

## 2020-01-19 ENCOUNTER — Ambulatory Visit: Payer: Medicare PPO

## 2020-01-19 ENCOUNTER — Telehealth: Payer: Self-pay | Admitting: Internal Medicine

## 2020-01-19 NOTE — Telephone Encounter (Signed)
FYI; [place] out of [place].

## 2020-01-29 NOTE — Telephone Encounter (Signed)
Noted moved to [facility] I believe to be closer to family   [place]

## 2022-03-23 ENCOUNTER — Encounter (INDEPENDENT_AMBULATORY_CARE_PROVIDER_SITE_OTHER): Payer: Self-pay
